# Patient Record
Sex: Female | Born: 1965 | Race: White | Hispanic: No | Marital: Married | Smoking: Never smoker
Health system: Southern US, Community
[De-identification: ages and names within clinical notes are randomized; demographics above are authoritative.]

## PROBLEM LIST (undated history)

## (undated) DIAGNOSIS — G43909 Migraine, unspecified, not intractable, without status migrainosus: Secondary | ICD-10-CM

## (undated) DIAGNOSIS — Z9889 Other specified postprocedural states: Secondary | ICD-10-CM

## (undated) DIAGNOSIS — G5603 Carpal tunnel syndrome, bilateral upper limbs: Secondary | ICD-10-CM

## (undated) DIAGNOSIS — T7840XA Allergy, unspecified, initial encounter: Secondary | ICD-10-CM

## (undated) DIAGNOSIS — R112 Nausea with vomiting, unspecified: Secondary | ICD-10-CM

## (undated) DIAGNOSIS — E7800 Pure hypercholesterolemia, unspecified: Secondary | ICD-10-CM

## (undated) DIAGNOSIS — I10 Essential (primary) hypertension: Secondary | ICD-10-CM

## (undated) HISTORY — DX: Carpal tunnel syndrome, bilateral upper limbs: G56.03

## (undated) HISTORY — PX: APPENDECTOMY: SHX54

## (undated) HISTORY — DX: Migraine, unspecified, not intractable, without status migrainosus: G43.909

---

## 2003-02-15 HISTORY — PX: OTHER SURGICAL HISTORY: SHX169

## 2007-04-20 ENCOUNTER — Ambulatory Visit: Payer: Self-pay | Admitting: Internal Medicine

## 2008-10-02 ENCOUNTER — Ambulatory Visit: Payer: Self-pay | Admitting: Internal Medicine

## 2009-02-14 HISTORY — PX: TOTAL ABDOMINAL HYSTERECTOMY: SHX209

## 2009-10-23 ENCOUNTER — Ambulatory Visit: Payer: Self-pay | Admitting: Internal Medicine

## 2010-11-11 ENCOUNTER — Ambulatory Visit: Payer: Self-pay | Admitting: Family Medicine

## 2011-04-29 ENCOUNTER — Ambulatory Visit: Payer: Self-pay | Admitting: Unknown Physician Specialty

## 2012-04-06 ENCOUNTER — Ambulatory Visit: Payer: Self-pay | Admitting: Family Medicine

## 2013-05-03 ENCOUNTER — Ambulatory Visit: Payer: Self-pay | Admitting: Family Medicine

## 2013-05-09 ENCOUNTER — Ambulatory Visit: Payer: Self-pay | Admitting: Family Medicine

## 2014-05-09 ENCOUNTER — Ambulatory Visit: Payer: Self-pay | Admitting: Family Medicine

## 2015-12-07 ENCOUNTER — Other Ambulatory Visit: Payer: Self-pay | Admitting: Internal Medicine

## 2015-12-07 DIAGNOSIS — Z0000 Encounter for general adult medical examination without abnormal findings: Secondary | ICD-10-CM

## 2016-01-22 ENCOUNTER — Ambulatory Visit: Payer: Self-pay

## 2016-01-25 ENCOUNTER — Ambulatory Visit
Admission: RE | Admit: 2016-01-25 | Discharge: 2016-01-25 | Disposition: A | Discharge status: Home / Self Care | Payer: 59 | Source: Ambulatory Visit | Attending: Internal Medicine | Admitting: Internal Medicine

## 2016-01-25 DIAGNOSIS — Z1231 Encounter for screening mammogram for malignant neoplasm of breast: Secondary | ICD-10-CM | POA: Diagnosis present

## 2016-01-25 DIAGNOSIS — Z0000 Encounter for general adult medical examination without abnormal findings: Secondary | ICD-10-CM

## 2016-09-09 ENCOUNTER — Other Ambulatory Visit: Payer: Self-pay | Admitting: Internal Medicine

## 2016-09-09 DIAGNOSIS — R7401 Elevation of levels of liver transaminase levels: Secondary | ICD-10-CM

## 2016-09-09 DIAGNOSIS — R740 Nonspecific elevation of levels of transaminase and lactic acid dehydrogenase [LDH]: Principal | ICD-10-CM

## 2016-09-12 ENCOUNTER — Ambulatory Visit
Admission: RE | Admit: 2016-09-12 | Discharge: 2016-09-12 | Disposition: A | Discharge status: Home / Self Care | Payer: 59 | Source: Ambulatory Visit | Attending: Internal Medicine | Admitting: Internal Medicine

## 2016-09-12 DIAGNOSIS — R7401 Elevation of levels of liver transaminase levels: Secondary | ICD-10-CM

## 2016-09-12 DIAGNOSIS — R740 Nonspecific elevation of levels of transaminase and lactic acid dehydrogenase [LDH]: Principal | ICD-10-CM

## 2016-11-01 ENCOUNTER — Encounter: Payer: Self-pay | Admitting: Neurology

## 2016-11-02 ENCOUNTER — Encounter: Payer: Self-pay | Admitting: Neurology

## 2016-11-02 ENCOUNTER — Ambulatory Visit (INDEPENDENT_AMBULATORY_CARE_PROVIDER_SITE_OTHER): Payer: 59 | Admitting: Neurology

## 2016-11-02 VITALS — BP 121/88 | HR 82 | Ht 62.0 in | Wt 180.0 lb

## 2016-11-02 DIAGNOSIS — N951 Menopausal and female climacteric states: Secondary | ICD-10-CM

## 2016-11-02 DIAGNOSIS — R0683 Snoring: Secondary | ICD-10-CM | POA: Diagnosis not present

## 2016-11-02 NOTE — Progress Notes (Signed)
SLEEP MEDICINE CLINIC   Provider:  Larey Seat, M D  Primary Care Physician:  Leanna Battles, MD   Referring Provider: Leanna Battles, MD    Chief Complaint  Patient presents with  . New Patient (Initial Visit)    pt here with husband. rm 10. pt states she snores. pt feels tired thru out the day.    HPI:  Daisy Robertson is a 51 y.o. female , seen here as in a referral  from Dr. Philip Aspen for Evaluation of possible sleep apnea.  Daisy Robertson is a Caucasian, right-handed married female patient of Dr. Shon Baton who presents with a chief complaint of nonrestorative sleep. She also experiences hot flashes at night, wakes up not feeling as refreshed as she used to. Her husband has witnessed her to snore. The hot flashes also has been present for at least 2-3 years. The patient is status post partial hysterectomy and has a Raquel Sarna history of breast cancer in one sister, her mother had coronary artery disease, and she had a vague recollection of a paternal uncle died of cancer, her own father died of cancer when she was age 57.   Sleep habits are as follows: The patient aims for a bedtime of 11 PM-. usually spends the last hour before she goes to bed watching TV but the couple also has an active social life. She describes her bedroom is cool and quiet but not dark.  The couple has a cat in bed with them. Daisy Robertson has no problems to go to sleep, her husband usually falls asleep later than her but also can sleep longer. He uses a CPAP machine and carries a diagnosis of obstructive sleep apnea. Daisy Robertson sleeps on her back and sides, not prone. Has no nocturia, and usually does not wake up other than from feeling hot or cold. She rises in the morning at 4:30 and is usually awake spontaneously at that time. She usually hits the snooze button on her alarm one time before she goes.   Sleep medical history and family sleep history: breast cancer in one of 2 sisters, father died of a cancer ,  unknown type. Paternal uncle,too. Mother died at 87 COPD, CAD, CVA. Maternal aunt had leukemia and COPD, CAD. maternal grandmother died of MI, as did one brother.   Social history: married to " weatherman " Chrissie Noa, childless, both are remarried. Caffeine one cup each morning, seldom iced tea, not soda.  Alcohol none,  No tobacco [place].Works at [employer].   Review of Systems: Out of a complete 14 system review, the patient complains of only the following symptoms, and all other reviewed systems are negative.    Epworth score  12 , Fatigue severity score 30  , depression score n/a    Social History   Social History  . Marital status: Married    Spouse name: N/A  . Number of children: N/A  . Years of education: N/A   Occupational History  . Not on file.   Social History Main Topics  . Smoking status: Never Smoker  . Smokeless tobacco: Never Used  . Alcohol use No  . Drug use: No  . Sexual activity: Not on file   Other Topics Concern  . Not on file   Social History Narrative  . No narrative on file    Family History  Problem Relation Age of Onset  . Breast cancer Sister   . Heart failure Mother   . CAD Mother   .  Heart attack Brother   . Hyperlipidemia Brother     Past Medical History:  Diagnosis Date  . Bilateral carpal tunnel syndrome   . Migraines     Past Surgical History:  Procedure Laterality Date  . APPENDECTOMY     1981  . bunionectomy Left 2005  . TOTAL ABDOMINAL HYSTERECTOMY  2011    Current Outpatient Prescriptions  Medication Sig Dispense Refill  . aspirin EC 81 MG tablet Take by mouth.    Marland Kitchen atorvastatin (LIPITOR) 10 MG tablet TAKE 1 TABLET BY MOUTH ONCE DAILY.    Marland Kitchen loratadine-pseudoephedrine (CLARITIN-D 12-HOUR) 5-120 MG tablet Take 1 tablet by mouth 2 (two) times daily.    . Multiple Vitamin (MULTI-VITAMINS) TABS Take by mouth.     No current facility-administered medications for this visit.     Allergies as of 11/02/2016 - never reviewed    Allergen Reaction Noted  . Codeine Diarrhea, Nausea Only, and Nausea And Vomiting 08/26/2013    Vitals: BP 121/88   Pulse 82   Ht 5\' 2"  (1.575 m)   Wt 180 lb (81.6 kg)   BMI 32.92 kg/m  Last Weight:  Wt Readings from Last 1 Encounters:  11/02/16 180 lb (81.6 kg)   KGY:JEHU mass index is 32.92 kg/m.     Last Height:   Ht Readings from Last 1 Encounters:  11/02/16 5\' 2"  (1.575 m)    Physical exam:  General: The patient is awake, alert and appears not in acute distress. The patient is well groomed. Head: Normocephalic, atraumatic. Neck is supple. Mallampati 4 - tip of uvula not seen.   neck circumference: 16. Nasal airflow patent- some rhinitis, TMJ is notevident . Retrognathia is seen. Wore never any braces or retainers Cardiovascular:  Regular rate and rhythm, without  murmurs or carotid bruit, and without distended neck veins. Respiratory: Lungs are clear to auscultation.Skin:  Without evidence of edema, or rash.Trunk: BMI is 33. The patient's posture is erect.  Neurologic exam : The patient is awake and alert, oriented to place and time.   Attention span & concentration ability appears normal.  Speech is fluent,  without dysarthria, dysphonia or aphasia.  Mood and affect are appropriate.  Cranial nerves: Pupils are equal and briskly reactive to light. Funduscopic exam without evidence of pallor or edema.  Extraocular movements  in vertical and horizontal planes intact and without nystagmus. Visual fields by finger perimetry are intact. Hearing- loss- has hearing aids. Left ear most affected.  Facial sensation intact to fine touch. Facial motor strength is symmetric and tongue and uvula move midline. Shoulder shrug was symmetrical.  Motor exam:   Normal tone, muscle bulk and symmetric strength in all extremities. Sensory:  Fine touch, pinprick and vibration were tested in all extremities. Proprioception tested in the upper extremities was normal. Coordination:   Finger-to-nose maneuver  normal without evidence of ataxia, dysmetria or tremor. Gait and station: Patient walks without assistive device and is able unassisted to climb up to the exam table. Strength within normal limits. Stance is stable and normal. Turns with 3 Steps.  Deep tendon reflexes: in the upper and lower extremities are symmetric and intact. Babinski maneuver response is downgoing.   Assessment:  After physical and neurologic examination, review of laboratory studies,  Personal review of imaging studies, reports of other /same  Imaging studies, results of polysomnography and / or neurophysiology testing and pre-existing records as far as provided in visit., my assessment is   1)  except for a Mrs.  Englander reported snoring I have not had a report of her actually having apnea. She does have risk factors including an elevated BMI at 33, and elevated neck circumference, and a high-grade Mallampati. Her sleep quality may have actually decreased with the described menopausal symptoms. Since her sister was diagnosed with breast cancer she may be not considered a hormone replacement candidate, but I would like her to discuss this with her primary doctor and gynecologist. The hot flashes at night seem to play a major role in her sleep fragmentation and she may respond to bioidentical hormones such as soy products or just to a low-dose progesterone at night only.  The patient was advised of the nature of the diagnosed disorder , the treatment options and the  risks for general health and wellness arising from not treating the condition.   I spent more than  45 minutes of face to face time with the patient.  Greater than 50% of time was spent in counseling and coordination of care. We have discussed the diagnosis and differential and I answered the patient's questions.    Plan:  Treatment plan and additional workup :   HST for apnea screening.   Rv after wards.   Larey Seat, MD 02/15/7508,  2:58 AM  Certified in Neurology by ABPN Certified in [place] by [facility] [address], [place], [place] [address]

## 2016-11-02 NOTE — Patient Instructions (Signed)

## 2016-11-30 ENCOUNTER — Ambulatory Visit (INDEPENDENT_AMBULATORY_CARE_PROVIDER_SITE_OTHER): Payer: 59 | Admitting: Neurology

## 2016-11-30 DIAGNOSIS — G4733 Obstructive sleep apnea (adult) (pediatric): Secondary | ICD-10-CM | POA: Diagnosis not present

## 2016-11-30 DIAGNOSIS — N951 Menopausal and female climacteric states: Secondary | ICD-10-CM

## 2016-11-30 DIAGNOSIS — R0683 Snoring: Secondary | ICD-10-CM

## 2016-12-05 NOTE — Procedures (Signed)
[place], [place] [address] NAME:    Daisy Robertson                                                                           DOB: Nov 12, 1965 MEDICAL RECORD NUMBER   [id number]                                                             DOS: 12/01/2016 REFERRING PHYSICIAN: Leanna Battles, M.D. STUDY PERFORMED: Home Sleep Study HISTORY: Study ordered to rule out Apnea, based on witnessed snoring, apnea and excessive daytime sleepiness.       Daisy Robertson is a Caucasian, right-handed married female patient of Dr. Shon Baton who presents with a chief complaint of nonrestorative sleep. She also experiences hot flashes at night, wakes up not feeling as refreshed as she used to. Her husband has witnessed her to snore, she reports hot flashes present for at least 2-3 years.  Epworth Sleepiness score endorsed at 12 points, Fatigue severity score 30, BMI 33.   STUDY RESULTS: Total Recording: 7 hours 12 minutes Total Apnea/Hypopnea Index (AHI):  9.2 /hour and RDI 12.5/hr. Average Oxygen Saturation (SpO2) 94%, Lowest Oxygen Saturation: 82 %  Time Oxygen Saturation Below 89%: 5 min. = 1 % Average Heart Rate:  77 bpm (60-184)  IMPRESSION:  1) Mild sleep apnea, obstructive, with moderate snoring  2) Clinically significant hypoxemia  3) Tachycardia without bradycardia RECOMMENDATION: This degree of sleep apnea can be treated with CPAP, but may respond to a dental device or simply weight loss.  I certify that I have reviewed the raw data recording prior to the issuance of this report in accordance with the standards of the American Academy of Sleep Medicine (AASM). Larey Seat, MD   12-05-2016 Medical Director of [facility], and Diplomat of the ABPN and ABSM Accredited by [employer]

## 2016-12-06 ENCOUNTER — Telehealth: Payer: Self-pay | Admitting: Neurology

## 2016-12-06 NOTE — Telephone Encounter (Signed)
Called pt to discuss sleep study results. No answer from the patient. Pt does not have voicemail set up so was unable to LVM. Will try again.

## 2016-12-06 NOTE — Telephone Encounter (Signed)
Called the patient at the work number provided by the patient. No answer. LVM for the patient to call back.

## 2016-12-06 NOTE — Telephone Encounter (Signed)
Patient is returning a call. She can be reached at [phone] which is her work number until 4:30pm. Please leave a message if you get VM. After that time she can be reached on her cell phone at [phone].

## 2016-12-06 NOTE — Telephone Encounter (Signed)
-----   Message from Larey Seat, MD sent at 12/05/2016  8:28 AM EDT ----- Mild sleep apnea, moderate snoring. The treatment options are CPAP, dental device or weight loss. BMI should be 26. We can meet and discuss options. Larey Seat, MD

## 2016-12-06 NOTE — Telephone Encounter (Signed)
Pt returned call and I went over the sleep study results. Pt verbalized understanding and states that she would call us with what she decides to do rather it is the dental device or cpap or weight loss.

## 2017-01-09 ENCOUNTER — Other Ambulatory Visit: Payer: Self-pay | Admitting: Internal Medicine

## 2017-01-09 DIAGNOSIS — Z0000 Encounter for general adult medical examination without abnormal findings: Secondary | ICD-10-CM

## 2017-04-04 ENCOUNTER — Ambulatory Visit
Admission: RE | Admit: 2017-04-04 | Discharge: 2017-04-04 | Disposition: A | Discharge status: Home / Self Care | Payer: Managed Care, Other (non HMO) | Source: Ambulatory Visit | Attending: Internal Medicine | Admitting: Internal Medicine

## 2017-04-04 DIAGNOSIS — Z1231 Encounter for screening mammogram for malignant neoplasm of breast: Secondary | ICD-10-CM | POA: Insufficient documentation

## 2017-04-04 DIAGNOSIS — Z0000 Encounter for general adult medical examination without abnormal findings: Secondary | ICD-10-CM

## 2017-11-27 ENCOUNTER — Ambulatory Visit (INDEPENDENT_AMBULATORY_CARE_PROVIDER_SITE_OTHER): Payer: Self-pay

## 2017-11-27 ENCOUNTER — Ambulatory Visit: Payer: Managed Care, Other (non HMO)

## 2017-11-27 DIAGNOSIS — Z1211 Encounter for screening for malignant neoplasm of colon: Secondary | ICD-10-CM

## 2017-11-27 MED ORDER — NA SULFATE-K SULFATE-MG SULF 17.5-3.13-1.6 GM/177ML PO SOLN: 1.0000 | ORAL | 0 refills | Status: DC

## 2017-11-27 NOTE — Patient Instructions (Signed)
Daisy Robertson  Feb 19, 1965 MRN: [id number]     Procedure Date: 02/05/18 Time to register: 7:30am Place to register: [facility] Procedure Time: 8:30am Scheduled provider: Barney Drain, MD    PREPARATION FOR COLONOSCOPY WITH SUPREP BOWEL PREP KIT  Note: Suprep Bowel Prep Kit is a split-dose (2day) regimen. Consumption of BOTH 6-ounce bottles is required for a complete prep.  Please notify us immediately if you are diabetic, take iron supplements, or if you are on Coumadin or any other blood thinners.                                                                                                                                                   1 DAY BEFORE PROCEDURE:  DATE: 02/04/18   DAY: Sunday   clear liquids the entire day - NO SOLID FOOD.   At 6:00pm: Complete steps 1 through 4 below, using ONE (1) 6-ounce bottle, before going to bed. Step 1:  Pour ONE (1) 6-ounce bottle of SUPREP liquid into the mixing container.  Step 2:  Add cool drinking water to the 16 ounce line on the container and mix.  Note: Dilute the solution concentrate as directed prior to use. Step 3:  DRINK ALL the liquid in the container. Step 4:  You MUST drink an additional two (2) or more 16 ounce containers of water over the next one (1) hour.   Continue clear liquids.  DAY OF PROCEDURE:   DATE: 02/05/18   DAY: Monday If you take medications for your heart, blood pressure, or breathing, you may take these medications.   5 hours before your procedure at :3:30am Step 1:  Pour ONE (1) 6-ounce bottle of SUPREP liquid into the mixing container.  Step 2:  Add cool drinking water to the 16 ounce line on the container and mix.  Note: Dilute the solution concentrate as directed prior to use. Step 3:  DRINK ALL the liquid in the container. Step 4:  You MUST drink an additional two (2) or more 16 ounce containers of water over the next one (1) hour. You MUST complete the final glass of water at least 3  hours before your colonoscopy.   Nothing by mouth past 5:30am  You may take your morning medications with sip of water unless we have instructed otherwise.    Please see below for Dietary Information.  CLEAR LIQUIDS INCLUDE:  Water Jello (NOT red in color)   Ice Popsicles (NOT red in color)   Tea (sugar ok, no milk/cream) Powdered fruit flavored drinks  Coffee (sugar ok, no milk/cream) Gatorade/ Lemonade/ Kool-Aid  (NOT red in color)   Juice: apple, white grape, white cranberry Soft drinks  Clear bullion, consomme, broth (fat free beef/chicken/vegetable)  Carbonated beverages (any kind)  Strained chicken noodle soup Hard Candy   Remember: Clear liquids are  liquids that will allow you to see your fingers on the other side of a clear glass. Be sure liquids are NOT red in color, and not cloudy, but CLEAR.  DO NOT EAT OR DRINK ANY OF THE FOLLOWING:  Dairy products of any kind   Cranberry juice Tomato juice / V8 juice   Grapefruit juice Orange juice     Red grape juice  Do not eat any solid foods, including such foods as: cereal, oatmeal, yogurt, fruits, vegetables, creamed soups, eggs, bread, crackers, pureed foods in a blender, etc.   HELPFUL HINTS FOR DRINKING PREP SOLUTION:   Make sure prep is extremely cold. Mix and refrigerate the the morning of the prep. You may also put in the freezer.   You may try mixing some Crystal Light or Country Time Lemonade if you prefer. Mix in small amounts; add more if necessary.  Try drinking through a straw  Rinse mouth with water or a mouthwash between glasses, to remove after-taste.  Try sipping on a cold beverage /ice/ popsicles between glasses of prep.  Place a piece of sugar-free hard candy in mouth between glasses.  If you become nauseated, try consuming smaller amounts, or stretch out the time between glasses. Stop for 30-60 minutes, then slowly start back drinking.     OTHER INSTRUCTIONS  You will need a responsible adult at  least 50 years of age to accompany you and drive you home. This person must remain in the waiting room during your procedure. The hospital will cancel your procedure if you do not have a responsible adult with you.   1. Wear loose fitting clothing that is easily removed. 2. Leave jewelry and other valuables at home.  3. Remove all body piercing jewelry and leave at home. 4. Total time from sign-in until discharge is approximately 2-3 hours. 5. You should go home directly after your procedure and rest. You can resume normal activities the day after your procedure. 6. The day of your procedure you should not:  Drive  Make legal decisions  Operate machinery  Drink alcohol  Return to work   You may call the office (Dept: [phone]) before 5:00pm, or page the doctor on call [phone]) after 5:00pm, for further instructions, if necessary.   Insurance Information YOU WILL NEED TO CHECK WITH YOUR INSURANCE COMPANY FOR THE BENEFITS OF COVERAGE YOU HAVE FOR THIS PROCEDURE.  UNFORTUNATELY, NOT ALL INSURANCE COMPANIES HAVE BENEFITS TO COVER ALL OR PART OF THESE TYPES OF PROCEDURES.  IT IS YOUR RESPONSIBILITY TO CHECK YOUR BENEFITS, HOWEVER, WE WILL BE GLAD TO ASSIST YOU WITH ANY CODES YOUR INSURANCE COMPANY MAY NEED.    PLEASE NOTE THAT MOST INSURANCE COMPANIES WILL NOT COVER A SCREENING COLONOSCOPY FOR PEOPLE UNDER THE AGE OF 50  IF YOU HAVE BCBS INSURANCE, YOU MAY HAVE BENEFITS FOR A SCREENING COLONOSCOPY BUT IF POLYPS ARE FOUND THE DIAGNOSIS WILL CHANGE AND THEN YOU MAY HAVE A DEDUCTIBLE THAT WILL NEED TO BE MET. SO PLEASE MAKE SURE YOU CHECK YOUR BENEFITS FOR A SCREENING COLONOSCOPY AS WELL AS A DIAGNOSTIC COLONOSCOPY.

## 2017-11-27 NOTE — Progress Notes (Signed)
Gastroenterology Pre-Procedure Review  Request Date:11/27/17 Requesting Physician: [place]- no previous tcs.   PATIENT REVIEW QUESTIONS: The patient responded to the following health history questions as indicated:    1. Diabetes Melitis: no 2. Joint replacements in the past 12 months: no 3. Major health problems in the past 3 months: no 4. Has an artificial valve or MVP: no 5. Has a defibrillator: no 6. Has been advised in past to take antibiotics in advance of a procedure like teeth cleaning: no 7. Family history of colon cancer: no  8. Alcohol Use: no 9. History of sleep apnea: no  10. History of coronary artery or other vascular stents placed within the last 12 months: no 11. History of any prior anesthesia complications: no    MEDICATIONS & ALLERGIES:    Patient reports the following regarding taking any blood thinners:   Plavix? no Aspirin? yes (81mg ) Coumadin? no Brilinta? no Xarelto? no Eliquis? no Pradaxa? no Savaysa? no Effient? no  Patient confirms/reports the following medications:  Current Outpatient Medications  Medication Sig Dispense Refill  . aspirin EC 81 MG tablet Take by mouth.    . folic acid (FOLVITE) 287 MCG tablet Take 400 mcg by mouth daily.    Marland Kitchen gabapentin (NEURONTIN) 400 MG capsule Take 400 mg by mouth 3 (three) times daily.    Marland Kitchen losartan-hydrochlorothiazide (HYZAAR) 100-12.5 MG tablet Take 1 tablet by mouth daily.    . Multiple Vitamin (MULTI-VITAMINS) TABS Take by mouth.    . Omega-3 Fatty Acids (SALMON OIL-1000 PO) Take by mouth daily.    . pravastatin (PRAVACHOL) 20 MG tablet 20 mg daily.  1  . vitamin B-12 (CYANOCOBALAMIN) 1000 MCG tablet Take 1,000 mcg by mouth daily.    . vitamin B-6 (PYRIDOXINE) 25 MG tablet Take 25 mg by mouth daily.     No current facility-administered medications for this visit.     Patient confirms/reports the following allergies:  Allergies  Allergen Reactions  . Codeine  Diarrhea, Nausea Only and Nausea And Vomiting    No orders of the defined types were placed in this encounter.   AUTHORIZATION INFORMATION Primary Insurance: Unknown Jim #: [id number] Pre-Cert / Josem Kaufmann required: no  SCHEDULE INFORMATION: Procedure has been scheduled as follows:  Date: 02/05/18, Time: 8:30 Location: [facility]  This Gastroenterology Pre-Precedure Review Form is being routed to the following provider(s): Neil Crouch, PA

## 2017-12-07 NOTE — Progress Notes (Signed)
Ok to schedule.

## 2018-02-05 ENCOUNTER — Ambulatory Visit (HOSPITAL_COMMUNITY)
Admission: RE | Admit: 2018-02-05 | Discharge: 2018-02-05 | Disposition: A | Discharge status: Home / Self Care | Payer: Managed Care, Other (non HMO) | Attending: Gastroenterology | Admitting: Gastroenterology

## 2018-02-05 ENCOUNTER — Encounter (HOSPITAL_COMMUNITY): Payer: Self-pay | Admitting: *Deleted

## 2018-02-05 ENCOUNTER — Other Ambulatory Visit: Payer: Self-pay

## 2018-02-05 ENCOUNTER — Encounter (HOSPITAL_COMMUNITY)
Admission: RE | Disposition: A | Discharge status: Home / Self Care | Payer: Self-pay | Source: Home / Self Care | Attending: Gastroenterology

## 2018-02-05 DIAGNOSIS — K621 Rectal polyp: Secondary | ICD-10-CM

## 2018-02-05 DIAGNOSIS — Z7982 Long term (current) use of aspirin: Secondary | ICD-10-CM | POA: Insufficient documentation

## 2018-02-05 DIAGNOSIS — D120 Benign neoplasm of cecum: Secondary | ICD-10-CM | POA: Diagnosis not present

## 2018-02-05 DIAGNOSIS — K648 Other hemorrhoids: Secondary | ICD-10-CM | POA: Diagnosis not present

## 2018-02-05 DIAGNOSIS — Q438 Other specified congenital malformations of intestine: Secondary | ICD-10-CM | POA: Diagnosis not present

## 2018-02-05 DIAGNOSIS — D123 Benign neoplasm of transverse colon: Secondary | ICD-10-CM | POA: Diagnosis not present

## 2018-02-05 DIAGNOSIS — K5730 Diverticulosis of large intestine without perforation or abscess without bleeding: Secondary | ICD-10-CM | POA: Insufficient documentation

## 2018-02-05 DIAGNOSIS — Z1211 Encounter for screening for malignant neoplasm of colon: Secondary | ICD-10-CM | POA: Diagnosis present

## 2018-02-05 DIAGNOSIS — I10 Essential (primary) hypertension: Secondary | ICD-10-CM | POA: Insufficient documentation

## 2018-02-05 DIAGNOSIS — Z79899 Other long term (current) drug therapy: Secondary | ICD-10-CM | POA: Insufficient documentation

## 2018-02-05 DIAGNOSIS — D122 Benign neoplasm of ascending colon: Secondary | ICD-10-CM

## 2018-02-05 DIAGNOSIS — E7800 Pure hypercholesterolemia, unspecified: Secondary | ICD-10-CM | POA: Diagnosis not present

## 2018-02-05 HISTORY — DX: Essential (primary) hypertension: I10

## 2018-02-05 HISTORY — PX: COLONOSCOPY: SHX5424

## 2018-02-05 HISTORY — PX: POLYPECTOMY: SHX5525

## 2018-02-05 HISTORY — DX: Nausea with vomiting, unspecified: R11.2

## 2018-02-05 HISTORY — DX: Other specified postprocedural states: Z98.890

## 2018-02-05 HISTORY — DX: Pure hypercholesterolemia, unspecified: E78.00

## 2018-02-05 HISTORY — DX: Allergy, unspecified, initial encounter: T78.40XA

## 2018-02-05 SURGERY — COLONOSCOPY
Anesthesia: Moderate Sedation

## 2018-02-05 MED ORDER — ONDANSETRON HCL 4 MG/2ML IJ SOLN
INTRAMUSCULAR | Status: DC | PRN
  Administered 2018-02-05: 4 mg via INTRAVENOUS

## 2018-02-05 MED ORDER — ONDANSETRON HCL 4 MG/2ML IJ SOLN
INTRAMUSCULAR | Status: AC
  Filled 2018-02-05: qty 2

## 2018-02-05 MED ORDER — MEPERIDINE HCL 100 MG/ML IJ SOLN
INTRAMUSCULAR | Status: AC
  Filled 2018-02-05: qty 2

## 2018-02-05 MED ORDER — MIDAZOLAM HCL 5 MG/5ML IJ SOLN
INTRAMUSCULAR | Status: AC
  Filled 2018-02-05: qty 10

## 2018-02-05 MED ORDER — STERILE WATER FOR IRRIGATION IR SOLN
Status: DC | PRN
  Administered 2018-02-05: 1.5 mL

## 2018-02-05 MED ORDER — MIDAZOLAM HCL 5 MG/5ML IJ SOLN
INTRAMUSCULAR | Status: DC | PRN
  Administered 2018-02-05 (×2): 2 mg via INTRAVENOUS
  Administered 2018-02-05: 1 mg via INTRAVENOUS

## 2018-02-05 MED ORDER — SODIUM CHLORIDE 0.9 % IV SOLN
INTRAVENOUS | Status: DC
  Administered 2018-02-05: 08:00:00 via INTRAVENOUS

## 2018-02-05 MED ORDER — MEPERIDINE HCL 100 MG/ML IJ SOLN
INTRAMUSCULAR | Status: DC | PRN
  Administered 2018-02-05 (×2): 25 mg

## 2018-02-05 NOTE — Op Note (Addendum)
[facility] Patient Name: Daisy Robertson Procedure Date: 02/05/2018 8:29 AM MRN: [id number] Date of Birth: March 02, 1965 Attending MD: Barney Drain MD, MD CSN: [id number] Age: 52 Admit Type: Outpatient Procedure:                Colonoscopy WITH COLD SNARE POLYPECTOMY Indications:              Screening for colorectal malignant neoplasm Providers:                Barney Drain MD, MD, Gerome Sam, RN, Tammy                            Vaught, RN, Nelma Rothman, Technician Referring MD:             Ermalene Searing. Philip Aspen MD, MD Medicines:                Ondansetron 4 mg IV, Meperidine 50 mg IV, Midazolam                            5 mg IV Complications:            No immediate complications. Estimated Blood Loss:     Estimated blood loss was minimal. Procedure:                Pre-Anesthesia Assessment:                           - Prior to the procedure, a History and Physical                            was performed, and patient medications and                            allergies were reviewed. The patient's tolerance of                            previous anesthesia was also reviewed. The risks                            and benefits of the procedure and the sedation                            options and risks were discussed with the patient.                            All questions were answered, and informed consent                            was obtained. Prior Anticoagulants: The patient                            last took aspirin 2 days prior to the procedure.                            ASA Grade Assessment: II - A patient with mild  systemic disease. After reviewing the risks and                            benefits, the patient was deemed in satisfactory                            condition to undergo the procedure. After obtaining                            informed consent, the colonoscope was passed under                            direct vision.  Throughout the procedure, the                            patient's blood pressure, pulse, and oxygen                            saturations were monitored continuously. The                            [id number]-[id number] ([id number]) scope was introduced through                            the anus and advanced to the the cecum, identified                            by appendiceal orifice and ileocecal valve. The                            colonoscopy was somewhat difficult due to a                            tortuous colon. Successful completion of the                            procedure was aided by straightening and shortening                            the scope to obtain bowel loop reduction and                            COLOWRAP. The patient tolerated the procedure well.                            The quality of the bowel preparation was good. The                            ileocecal valve, appendiceal orifice, and rectum                            were photographed. Scope In: 8:49:03 AM Scope Out: 9:13:52 AM Scope Withdrawal Time: 0 hours 23 minutes 10 seconds  Total Procedure Duration:  0 hours 24 minutes 49 seconds  Findings:      Four sessile polyps were found in the transverse colon(2), hepatic       flexure and cecum. The polyps were 3 to 5 mm in size. These polyps were       removed with a cold snare. Resection and retrieval were complete.      A 6 mm polyp was found in the splenic flexure. The polyp was sessile.       The polyp was removed with a cold snare. Resection and retrieval were       complete. Coagulation for hemostasis of bleeding caused by the procedure       using snare was successful.      Internal hemorrhoids were found. The hemorrhoids were small.      The recto-sigmoid colon and sigmoid colon were mildly redundant. Impression:               - Four 3 to 5 mm polyps in the transverse colon(2),                            at the hepatic flexure and in the cecum, removed                             with a cold snare. Resected and retrieved.                           - One 6 mm polyp in the splenic flexure, removed                            with a hot snare. Resected and retrieved.                           - Diverticulosis in the sigmoid colon.                           - Internal hemorrhoids.                           - Redundant colon. Moderate Sedation:      Moderate (conscious) sedation was administered by the endoscopy nurse       and supervised by the endoscopist. The following parameters were       monitored: oxygen saturation, heart rate, blood pressure, and response       to care. Total physician intraservice time was 36 minutes. Recommendation:           - Patient has a contact number available for                            emergencies. The signs and symptoms of potential                            delayed complications were discussed with the                            patient. Return to normal activities tomorrow.  Written discharge instructions were provided to the                            patient.                           - High fiber diet.                           - Continue present medications.                           - Await pathology results.                           - Repeat colonoscopy in 3 years for surveillance. Procedure Code(s):        --- Professional ---                           [phone], Colonoscopy, flexible; with removal of                            tumor(s), polyp(s), or other lesion(s) by snare                            technique                           99153, Moderate sedation; each additional 15                            minutes intraservice time                           G0500, Moderate sedation services provided by the                            same physician or other qualified health care                            professional performing a gastrointestinal                             endoscopic service that sedation supports,                            requiring the presence of an independent trained                            observer to assist in the monitoring of the                            patient's level of consciousness and physiological                            status; initial 15 minutes of intra-service time;  patient age 46 years or older (additional time may                            be reported with [phone], as appropriate) Diagnosis Code(s):        --- Professional ---                           Z12.11, Encounter for screening for malignant                            neoplasm of colon                           D12.3, Benign neoplasm of transverse colon (hepatic                            flexure or splenic flexure)                           D12.2, Benign neoplasm of ascending colon                           K62.1, Rectal polyp                           K64.8, Other hemorrhoids                           K57.30, Diverticulosis of large intestine without                            perforation or abscess without bleeding                           Q43.8, Other specified congenital malformations of                            intestine CPT copyright 2018 American Medical Association. All rights reserved. The codes documented in this report are preliminary and upon coder review may  be revised to meet current compliance requirements. Barney Drain, MD Barney Drain MD, MD 02/05/2018 10:58:37 AM This report has been signed electronically. Number of Addenda: 0

## 2018-02-05 NOTE — H&P (Signed)
Primary Care Physician:  Leanna Battles, MD Primary Gastroenterologist:  Dr. Oneida Alar  Pre-Procedure History & Physical: HPI:  Daisy Robertson is a 52 y.o. female here for [place].  Past Medical History:  Diagnosis Date  . Allergies   . Bilateral carpal tunnel syndrome   . Hypercholesteremia   . Hypertension   . Migraines   . PONV (postoperative nausea and vomiting)     Past Surgical History:  Procedure Laterality Date  . APPENDECTOMY     1981  . bunionectomy Left 2005  . TOTAL ABDOMINAL HYSTERECTOMY  2011    Prior to Admission medications   Medication Sig Start Date End Date Taking? Authorizing Provider  aspirin EC 81 MG tablet Take 81 mg by mouth every evening.    Yes [provider]  FOLIC ACID-VIT G9-EEF E07 PO Take 1 tablet by mouth daily.   Yes [provider]  gabapentin (NEURONTIN) 400 MG capsule Take 400 mg by mouth 3 (three) times daily.   Yes [provider]  losartan-hydrochlorothiazide (HYZAAR) 100-12.5 MG tablet Take 1 tablet by mouth daily.   Yes [provider]  Multiple Vitamin (MULTI-VITAMINS) TABS Take 1 tablet by mouth every evening.    Yes [provider]  Na Sulfate-K Sulfate-Mg Sulf (SUPREP BOWEL PREP KIT) 17.5-3.13-1.6 GM/177ML SOLN Take 1 kit by mouth as directed. 11/27/17  Yes Mahala Menghini, PA-C  Omega-3 Fatty Acids (SALMON OIL-1000 PO) Take 1,000 mg by mouth 2 (two) times daily.    Yes [provider]  pravastatin (PRAVACHOL) 20 MG tablet Take 20 mg by mouth every evening.  10/03/17  Yes [provider]  cetirizine (ZYRTEC) 10 MG tablet Take 10 mg by mouth at bedtime as needed for allergies.    [provider]  ibuprofen (ADVIL,MOTRIN) 200 MG tablet Take 400 mg by mouth daily as needed for moderate pain.    [provider]  loratadine-pseudoephedrine (CLARITIN-D 12 HOUR) 5-120 MG tablet Take 1 tablet by mouth 2 (two) times daily as needed for allergies.     [provider]    Allergies as of 11/28/2017 - Review Complete 11/27/2017  Allergen Reaction Noted  . Codeine Diarrhea, Nausea Only, and Nausea And Vomiting 08/26/2013    Family History  Problem Relation Age of Onset  . Breast cancer Sister   . Heart failure Mother   . CAD Mother   . Heart attack Brother   . Hyperlipidemia Brother   . Colon cancer Neg Hx   . Colon polyps Neg Hx     Social History   Socioeconomic History  . Marital status: Married    Spouse name: Not on file  . Number of children: Not on file  . Years of education: Not on file  . Highest education level: Not on file  Occupational History  . Not on file  Social Needs  . Financial resource strain: Not on file  . Food insecurity:    Worry: Not on file    Inability: Not on file  . Transportation needs:    Medical: Not on file    Non-medical: Not on file  Tobacco Use  . Smoking status: Never Smoker  . Smokeless tobacco: Never Used  Substance and Sexual Activity  . Alcohol use: No  . Drug use: No  . Sexual activity: Not on file  Lifestyle  . Physical activity:    Days per week: Not on file    Minutes per session: Not on file  .  Stress: Not on file  Relationships  . Social connections:    Talks on phone: Not on file    Gets together: Not on file    Attends religious service: Not on file    Active member of club or organization: Not on file    Attends meetings of clubs or organizations: Not on file    Relationship status: Not on file  . Intimate partner violence:    Fear of current or ex partner: Not on file    Emotionally abused: Not on file    Physically abused: Not on file    Forced sexual activity: Not on file  Other Topics Concern  . Not on file  Social History Narrative  . Not on file    Review of Systems: See HPI, otherwise negative ROS   Physical Exam: BP 133/73   Pulse 84   Temp 98.4 F (36.9 C) (Oral)   Resp 18   Ht 5' 2" (1.575 m)   Wt 81.6 kg   SpO2 99%    BMI 32.92 kg/m  General:   Alert,  pleasant and cooperative in NAD Head:  Normocephalic and atraumatic. Neck:  Supple; Lungs:  Clear throughout to auscultation.    Heart:  Regular rate and rhythm. Abdomen:  Soft, nontender and nondistended. Normal bowel sounds, without guarding, and without rebound.   Neurologic:  Alert and  oriented x4;  grossly normal neurologically.  Impression/Plan:    SCREENING  Plan:  1. TCS TODAY DISCUSSED PROCEDURE, BENEFITS, & RISKS: < 1% chance of medication reaction, bleeding, perforation, or rupture of spleen/liver.

## 2018-02-05 NOTE — Discharge Instructions (Signed)
You had 5 polyps removed. You have SMALL internal hemorrhoids.   DRINK WATER TO KEEP YOUR URINE LIGHT YELLOW.  FOLLOW A HIGH FIBER DIET. AVOID ITEMS THAT CAUSE BLOATING & GAS. SEE INFO BELOW.  CONTINUE YOUR WEIGHT LOSS EFFORTS. YOUR BODY MASS INDEX IS OVER 30 WHICH MEANS YOU ARE OBESE. OBESITY IS ASSOCIATED WITH AN INCREASED FOR CIRRHOSIS AND ALL CANCERS, INCLUDING ESOPHAGEAL AND COLON CANCER. A WEIGHT OF 160 LBS OR LESS  WILL GET YOUR BODY MASS INDEX(BMI) UNDER 30.  YOUR BIOPSY RESULTS WILL BE BACK IN 5 BUSINESS DAYS.   Next colonoscopy in 3 years.    Colonoscopy Care After Read the instructions outlined below and refer to this sheet in the next week. These discharge instructions provide you with general information on caring for yourself after you leave the hospital. While your treatment has been planned according to the most current medical practices available, unavoidable complications occasionally occur. If you have any problems or questions after discharge, call DR. Marie Chow, [phone].  ACTIVITY  You may resume your regular activity, but move at a slower pace for the next 24 hours.   Take frequent rest periods for the next 24 hours.   Walking will help get rid of the air and reduce the bloated feeling in your belly (abdomen).   No driving for 24 hours (because of the medicine (anesthesia) used during the test).   You may shower.   Do not sign any important legal documents or operate any machinery for 24 hours (because of the anesthesia used during the test).    NUTRITION  Drink plenty of fluids.   You may resume your normal diet as instructed by your doctor.   Begin with a light meal and progress to your normal diet. Heavy or fried foods are harder to digest and may make you feel sick to your stomach (nauseated).   Avoid alcoholic beverages for 24 hours or as instructed.    MEDICATIONS  You may resume your normal medications.   WHAT YOU CAN EXPECT  TODAY  Some feelings of bloating in the abdomen.   Passage of more gas than usual.   Spotting of blood in your stool or on the toilet paper  .  IF YOU HAD POLYPS REMOVED DURING THE COLONOSCOPY:  Eat a soft diet IF YOU HAVE NAUSEA, BLOATING, ABDOMINAL PAIN, OR VOMITING.    FINDING OUT THE RESULTS OF YOUR TEST Not all test results are available during your visit. DR. Oneida Alar WILL CALL YOU WITHIN 14 DAYS OF YOUR PROCEDUE WITH YOUR RESULTS. Do not assume everything is normal if you have not heard from DR. Cletis Clack, CALL HER OFFICE AT [phone].  SEEK IMMEDIATE MEDICAL ATTENTION AND CALL THE OFFICE: [phone] IF:  You have more than a spotting of blood in your stool.   Your belly is swollen (abdominal distention).   You are nauseated or vomiting.   You have a temperature over 101F.   You have abdominal pain or discomfort that is severe or gets worse throughout the day.   High-Fiber Diet A high-fiber diet changes your normal diet to include more whole grains, legumes, fruits, and vegetables. Changes in the diet involve replacing refined carbohydrates with unrefined foods. The calorie level of the diet is essentially unchanged. The Dietary Reference Intake (recommended amount) for adult males is 38 grams per day. For adult females, it is 25 grams per day. Pregnant and lactating women should consume 28 grams of fiber per day. Fiber is the intact part of  a plant that is not broken down during digestion. Functional fiber is fiber that has been isolated from the plant to provide a beneficial effect in the body. PURPOSE  Increase stool bulk.   Ease and regulate bowel movements.   Lower cholesterol.   REDUCE RISK OF COLON CANCER  INDICATIONS THAT YOU NEED MORE FIBER  Constipation and hemorrhoids.   Uncomplicated diverticulosis (intestine condition) and irritable bowel syndrome.   Weight management.   As a protective measure against hardening of the arteries (atherosclerosis),  diabetes, and cancer.   GUIDELINES FOR INCREASING FIBER IN THE DIET  Start adding fiber to the diet slowly. A gradual increase of about 5 more grams (2 slices of whole-wheat bread, 2 servings of most fruits or vegetables, or 1 bowl of high-fiber cereal) per day is best. Too rapid an increase in fiber may result in constipation, flatulence, and bloating.   Drink enough water and fluids to keep your urine clear or pale yellow. Water, juice, or caffeine-free drinks are recommended. Not drinking enough fluid may cause constipation.   Eat a variety of high-fiber foods rather than one type of fiber.   Try to increase your intake of fiber through using high-fiber foods rather than fiber pills or supplements that contain small amounts of fiber.   The goal is to change the types of food eaten. Do not supplement your present diet with high-fiber foods, but replace foods in your present diet.   INCLUDE A VARIETY OF FIBER SOURCES  Replace refined and processed grains with whole grains, canned fruits with fresh fruits, and incorporate other fiber sources. White rice, white breads, and most bakery goods contain little or no fiber.   Brown whole-grain rice, buckwheat oats, and many fruits and vegetables are all good sources of fiber. These include: broccoli, Brussels sprouts, cabbage, cauliflower, beets, sweet potatoes, white potatoes (skin on), carrots, tomatoes, eggplant, squash, berries, fresh fruits, and dried fruits.   Cereals appear to be the richest source of fiber. Cereal fiber is found in whole grains and bran. Bran is the fiber-rich outer coat of cereal grain, which is largely removed in refining. In whole-grain cereals, the bran remains. In breakfast cereals, the largest amount of fiber is found in those with "bran" in their names. The fiber content is sometimes indicated on the label.   You may need to include additional fruits and vegetables each day.   In baking, for 1 cup white flour, you may  use the following substitutions:   1 cup whole-wheat flour minus 2 tablespoons.   1/2 cup white flour plus 1/2 cup whole-wheat flour.   Polyps, Colon  A polyp is extra tissue that grows inside your body. Colon polyps grow in the large intestine. The large intestine, also called the colon, is part of your digestive system. It is a long, hollow tube at the end of your digestive tract where your body makes and stores stool. Most polyps are not dangerous. They are benign. This means they are not cancerous. But over time, some types of polyps can turn into cancer. Polyps that are smaller than a pea are usually not harmful. But larger polyps could someday become or may already be cancerous. To be safe, doctors remove all polyps and test them.   WHO GETS POLYPS? Anyone can get polyps, but certain people are more likely than others. You may have a greater chance of getting polyps if:  You are over 50.   You have had polyps before.   Someone  in your family has had polyps.   Someone in your family has had cancer of the large intestine.   Find out if someone in your family has had polyps. You may also be more likely to get polyps if you:   Eat a lot of fatty foods   Smoke   Drink alcohol   Do not exercise  Eat too much   PREVENTION There is not one sure way to prevent polyps. You might be able to lower your risk of getting them if you:  Eat more fruits and vegetables and less fatty food.   Do not smoke.   Avoid alcohol.   Exercise every day.   Lose weight if you are overweight.   Eating more calcium and folate can also lower your risk of getting polyps. Some foods that are rich in calcium are milk, cheese, and broccoli. Some foods that are rich in folate are chickpeas, kidney beans, and spinach.   Hemorrhoids Hemorrhoids are dilated (enlarged) veins around the rectum. Sometimes clots will form in the veins. This makes them swollen and painful. These are called thrombosed  hemorrhoids. Causes of hemorrhoids include:  Constipation.   Straining to have a bowel movement.   HEAVY LIFTING  HOME CARE INSTRUCTIONS  Eat a well balanced diet and drink 6 to 8 glasses of water every day to avoid constipation. You may also use a bulk laxative.   Avoid straining to have bowel movements.   Keep anal area dry and clean.   Do not use a donut shaped pillow or sit on the toilet for long periods. This increases blood pooling and pain.   Move your bowels when your body has the urge; this will require less straining and will decrease pain and pressure.

## 2018-02-08 ENCOUNTER — Telehealth: Payer: Self-pay | Admitting: Gastroenterology

## 2018-02-08 NOTE — Telephone Encounter (Addendum)
FIVE SIMPLE ADENOMAS REMOVED.     DRINK WATER TO KEEP YOUR URINE LIGHT YELLOW.  FOLLOW A HIGH FIBER DIET. AVOID ITEMS THAT CAUSE BLOATING & GAS.   CONTINUE YOUR WEIGHT LOSS EFFORTS. A WEIGHT OF 160 LBS OR LESS  WILL GET YOUR BODY MASS INDEX(BMI) UNDER 30.  Next colonoscopy in 3 years.

## 2018-02-09 NOTE — Telephone Encounter (Signed)
Called and informed pt.  

## 2018-02-12 ENCOUNTER — Encounter (HOSPITAL_COMMUNITY): Payer: Self-pay | Admitting: Gastroenterology

## 2018-02-12 NOTE — Telephone Encounter (Signed)
ON RECALL  °

## 2018-02-28 ENCOUNTER — Ambulatory Visit (INDEPENDENT_AMBULATORY_CARE_PROVIDER_SITE_OTHER): Payer: Managed Care, Other (non HMO) | Admitting: Orthopaedic Surgery

## 2018-02-28 ENCOUNTER — Telehealth (INDEPENDENT_AMBULATORY_CARE_PROVIDER_SITE_OTHER): Payer: Self-pay | Admitting: Radiology

## 2018-02-28 ENCOUNTER — Encounter (INDEPENDENT_AMBULATORY_CARE_PROVIDER_SITE_OTHER): Payer: Self-pay | Admitting: Orthopaedic Surgery

## 2018-02-28 VITALS — BP 115/66 | HR 79 | Ht 62.0 in | Wt 175.0 lb

## 2018-02-28 DIAGNOSIS — G5603 Carpal tunnel syndrome, bilateral upper limbs: Secondary | ICD-10-CM | POA: Diagnosis not present

## 2018-02-28 NOTE — Telephone Encounter (Signed)
Referral edited to send to [facility]

## 2018-02-28 NOTE — Telephone Encounter (Signed)
Patient was seen in the office this morning and Dr. Durward Fortes ordered bilateral upper extremity EMG's to [facility].  She wanted to go in [place], but found out that they are not contracted with her insurance. She requests study to be done a [facility] as she has been there before.  Can you please edit referral?  CB for patient is [phone]

## 2018-02-28 NOTE — Progress Notes (Signed)
Office Visit Note   Patient: Daisy Robertson           Date of Birth: [birth year]           MRN: [id number] Visit Date: 02/28/2018              Requested by: Leanna Battles, MD Daisy Robertson, [place] [address] PCP: Leanna Battles, MD   Assessment & Plan: Visit Diagnoses:  1. Carpal tunnel syndrome, bilateral     Plan: Bilateral hand symptoms are consistent with carpal tunnel syndrome.  Problem has been present off and on for many years.  Will obtain EMGs and nerve conduction studies and consider treatment options based on the results  Follow-Up Instructions: Return after EMG's.   Orders:  Orders Placed This Encounter  Procedures  . Ambulatory referral to Physical Medicine Rehab   No orders of the defined types were placed in this encounter.     Procedures: No procedures performed   Clinical Data: No additional findings.   Subjective: Chief Complaint  Patient presents with  . Right Hand - Pain  . Left Hand - Pain  Patient presents with bilateral hand pain, right greater than left. She describes numbness and tingling. The pain seems to shoot through her right thumb and the thumb is numb on the end. This is a chronic condition and now wakes her at night. She states that she has tried braces which do not help. She takes gabapentin and aspirin 81mg  daily.  She is taking diclofenac which helps a little.  [name] relates that she has had trouble with numbness tingling and pain in both of her hands for "years".  She works for [employer] work on a daily basis and thinks that may have "something to do with it".  She has had particular trouble with numbness of the tip of her right thumb but also other fingers.  She is dropping objects, waking up at night and having difficulty driving her car with her hands and to sleep.  The more trouble on the right than the left.  She is tried braces and gabapentin and even diclofenac.  Prior diagnostic  studies HPI  Review of Systems   Objective: Vital Signs: BP 115/66   Pulse 79   Ht 5\' 2"  (1.575 m)   Wt 175 lb (79.4 kg)   BMI 32.01 kg/m   Physical Exam Constitutional:      Appearance: She is well-developed.  Eyes:     Pupils: Pupils are equal, round, and reactive to light.  Pulmonary:     Effort: Pulmonary effort is normal.  Skin:    General: Skin is warm and dry.  Neurological:     Mental Status: She is alert and oriented to person, place, and time.  Psychiatric:        Behavior: Behavior normal.     Ortho Exam awake alert and oriented x3.  Comfortable rest.  Positive Tinel's over the median nerve at both wrists.  No swelling of her hand or digits either side.  Good opposition of thumb to little finger.  Phalen's test was negative no joint swelling.  Good capillary refill above fingers  Specialty Comments:  No specialty comments available.  Imaging: No results found.   PMFS History: Patient Active Problem List   Diagnosis Date Noted  . Special screening for malignant neoplasms, colon    Past Medical History:  Diagnosis Date  . Allergies   . Bilateral carpal tunnel syndrome   .  Hypercholesteremia   . Hypertension   . Migraines   . PONV (postoperative nausea and vomiting)     Family History  Problem Relation Age of Onset  . Breast cancer Sister   . Heart failure Mother   . CAD Mother   . Heart attack Brother   . Hyperlipidemia Brother   . Colon cancer Neg Hx   . Colon polyps Neg Hx     Past Surgical History:  Procedure Laterality Date  . APPENDECTOMY     1981  . bunionectomy Left 2005  . COLONOSCOPY N/A 02/05/2018   Procedure: COLONOSCOPY;  Surgeon: Danie Binder, MD;  [facility];  Service: Endoscopy;  Laterality: N/A;  8:30  . POLYPECTOMY  02/05/2018   Procedure: POLYPECTOMY;  Surgeon: Danie Binder, MD;  [facility];  Service: Endoscopy;;  Cecal,Hep.flex,Transverse,Spl.flex,  . TOTAL ABDOMINAL HYSTERECTOMY  2011    Social History   Occupational History  . Not on file  Tobacco Use  . Smoking status: Never Smoker  . Smokeless tobacco: Never Used  Substance and Sexual Activity  . Alcohol use: No  . Drug use: No  . Sexual activity: Not on file

## 2018-03-09 ENCOUNTER — Telehealth (INDEPENDENT_AMBULATORY_CARE_PROVIDER_SITE_OTHER): Payer: Self-pay | Admitting: Radiology

## 2018-03-09 NOTE — Telephone Encounter (Signed)
Patient left voicemail requesting return call in regards to EMG/NCS. She states that she has not heard anything from [facility].  Per Gabriel Cirri, referral is entered for there and ready to be scheduled. I called patient at [phone] and advised she could call [facility] if she has not heard from them and they should be able to see referral from work que and schedule. She will call me back if needed.

## 2018-04-10 ENCOUNTER — Encounter: Payer: Managed Care, Other (non HMO) | Admitting: Neurology

## 2018-04-10 ENCOUNTER — Ambulatory Visit (INDEPENDENT_AMBULATORY_CARE_PROVIDER_SITE_OTHER): Payer: Managed Care, Other (non HMO) | Admitting: Neurology

## 2018-04-10 DIAGNOSIS — R200 Anesthesia of skin: Secondary | ICD-10-CM | POA: Diagnosis not present

## 2018-04-10 DIAGNOSIS — G5603 Carpal tunnel syndrome, bilateral upper limbs: Secondary | ICD-10-CM | POA: Diagnosis not present

## 2018-04-10 DIAGNOSIS — Z0289 Encounter for other administrative examinations: Secondary | ICD-10-CM

## 2018-04-10 NOTE — Progress Notes (Signed)
Full Name: Elliette Seabolt Gender: Female MRN #: [id number] Date of Birth: 10/10/1965    Visit Date: 04/10/2018 09:06 Age: 53 Years 57 Months Old Examining Physician: Arlice Colt, MD  Referring Physician: Joni Fears, MD     History:  Daisy Robertson is a 53 year old woman with bilateral hand numbness and pain and occasional neck pain.    Strength was slightly reduced in the right APB muscle and normal elsewhere in the arms.  Sensation was normal.  Nerve conductions studies: The right median motor response was mildly slowed across the wrist with a low normal amplitude and normal forearm conduction.  The left median motor response in both ulnar sensory responses were normal.  The right median sensory response was slightly delayed across the wrist.  The right ulnar sensory response was normal.  The left median sensory response was normal but more than 0.5 ms slower than the left ulnar sensory response.  Electromyography: Needle EMG of selected muscles of the right arm showed mild chronic denervation in the biceps.  Other muscles tested were normal.  There was no abnormal spontaneous activity.  Impression: This NCV/EMG study shows the following: 1.    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 2.    Minimal median neuropathy across the left wrist 3.    There is mild chronic denervation in the right biceps while other muscles were normal.  There could be a very mild left C5 or C6 chronic radiculopathy.  Ayson Cherubini A. Felecia Shelling, MD, PhD, FAAN Certified in Neurology, Clinical Neurophysiology, Sleep Medicine, Pain Medicine and Neuroimaging Director, [place] [facility] [address], [place], [place] [address] [phone]            [facility]    Nerve / Sites Muscle Latency Ref. Amplitude Ref. Rel Amp Segments Distance Velocity Ref. Area    ms ms mV mV %  cm m/s  m/s mVms  L Median - APB     Wrist APB 3.2 ?4.4 8.0 ?4.0 100 Wrist - APB 7   32.5     Upper arm APB 6.8  8.0  100 Upper arm - Wrist 19 54 ?49 32.1  R Median - APB     Wrist APB 4.4 ?4.4 4.7 ?4.0 100 Wrist - APB 7   17.6     Upper arm APB 8.2  4.3  91.6 Upper arm - Wrist 19 49 ?49 15.5  L Ulnar - ADM     Wrist ADM 2.0 ?3.3 10.6 ?6.0 100 Wrist - ADM 7   33.4     B.Elbow ADM 5.2  10.3  97.7 B.Elbow - Wrist 17 54 ?49 32.3     A.Elbow ADM 7.2  10.0  96.8 A.Elbow - B.Elbow 10 49 ?49 31.5         A.Elbow - Wrist      R Ulnar - ADM     Wrist ADM 2.3 ?3.3 10.7 ?6.0 100 Wrist - ADM 7   29.3     B.Elbow ADM 5.2  10.4  97.2 B.Elbow - Wrist 17 58 ?49 29.9     A.Elbow ADM 7.0  10.3  98.9 A.Elbow - B.Elbow 10 56 ?49 29.9         A.Elbow - Wrist                 SNC    Nerve / Sites Rec. Site  Peak Lat Ref.  Amp Ref. Segments Distance    ms ms V V  cm  L Median - Orthodromic (Dig II, Mid palm)     Dig II Wrist 3.1 ?3.4 12 ?10 Dig II - Wrist 13  R Median - Orthodromic (Dig II, Mid palm)     Dig II Wrist 3.4 ?3.4 15 ?10 Dig II - Wrist 13  L Ulnar - Orthodromic, (Dig V, Mid palm)     Dig V Wrist 2.3 ?3.1 7 ?5 Dig V - Wrist 11  R Ulnar - Orthodromic, (Dig V, Mid palm)     Dig V Wrist 2.3 ?3.1 8 ?5 Dig V - Wrist 34              F  Wave    Nerve F Lat Ref.   ms ms  L Ulnar - ADM 24.6 ?32.0  R Ulnar - ADM 24.9 ?32.0         EMG full       EMG Summary Table    Spontaneous MUAP Recruitment  Muscle IA Fib PSW Fasc Other Amp Dur. Poly Pattern  R. Deltoid Normal None None None _______ Normal Normal Normal Normal  R. Triceps brachii Normal None None None _______ Normal Normal Normal Normal  R. Biceps brachii Normal None None None _______ Normal Increased 1+ Reduced  R. Extensor digitorum communis Normal None None None _______ Normal Normal Normal Normal  R. First dorsal interosseous Normal None None None _______ Normal Normal Normal Normal  R. Abductor pollicis brevis Normal None None None  _______ Normal Normal Normal Normal  R. Rhomboid major Normal None None None _______ Normal Normal Normal Normal

## 2018-04-11 ENCOUNTER — Ambulatory Visit (INDEPENDENT_AMBULATORY_CARE_PROVIDER_SITE_OTHER): Payer: Managed Care, Other (non HMO) | Admitting: Orthopaedic Surgery

## 2018-04-11 ENCOUNTER — Encounter (INDEPENDENT_AMBULATORY_CARE_PROVIDER_SITE_OTHER): Payer: Self-pay | Admitting: Orthopaedic Surgery

## 2018-04-11 VITALS — BP 120/71 | HR 115 | Ht 62.0 in | Wt 180.0 lb

## 2018-04-11 DIAGNOSIS — G5603 Carpal tunnel syndrome, bilateral upper limbs: Secondary | ICD-10-CM | POA: Diagnosis not present

## 2018-04-11 NOTE — Progress Notes (Signed)
Office Visit Note   Patient: Daisy Robertson           Date of Birth: [birth year]           MRN: [id number] Visit Date: 04/11/2018              Requested by: Leanna Battles, MD [address] [place], [place] [address] PCP: Leanna Battles, MD   Assessment & Plan: Visit Diagnoses:  1. Carpal tunnel syndrome, bilateral     Plan: EMGs and nerve conduction studies were performed at [facility] by Dr. Felecia Shelling.  These demonstrated mild to moderate median neuropathy across the right wrist consistent with carpal tunnel syndrome and minimal medial neuropathy across the left wrist.  There was some mild chronic denervation of the right biceps with the other muscles were normal and possibly a very mild left C5-6 chronic radiculopathy.  Appear to be any symptoms referable to the biceps.  She is experiencing chronic problems with both of her wrists more so right than the left.  She has had prior treatment with splints and anti-inflammatory medicine at this point is very frustrated with her recurrent pain.  She is having issues with dropping objects and numbness and tingling when she uses her cell phone or when she drives her car.  After much discussion and discussing treatment options she would like to proceed with right carpal tunnel syndrome staging it with the left depending upon her results.  We will schedule at her convenience  Follow-Up Instructions: No follow-ups on file.   Orders:  No orders of the defined types were placed in this encounter.  No orders of the defined types were placed in this encounter.     Procedures: No procedures performed   Clinical Data: No additional findings.   Subjective: Chief Complaint  Patient presents with  . Left Wrist - Follow-up  . Right Wrist - Follow-up  Patient presents today for follow up on both wrist.She had an EMG/ NCV on 04/10/2018. Patient is here for results today. No change in symptoms.  Has had chronic problems with  numbness and tingling in both hands but more so right than left using a cell phone or even driving her car.  She has used splints in the past and taken anti-inflammatory medicines EMGs and nerve conduction studies are outlined as above.  She has taken gabapentin 100 mg 3 times a day and Advil without significant relief. she is not diabetic  HPI  Review of Systems   Objective: Vital Signs: BP 120/71   Pulse (!) 115   Ht 5\' 2"  (1.575 m)   Wt 180 lb (81.6 kg)   BMI 32.92 kg/m   Physical Exam Constitutional:      Appearance: She is well-developed.  Eyes:     Pupils: Pupils are equal, round, and reactive to light.  Pulmonary:     Effort: Pulmonary effort is normal.  Skin:    General: Skin is warm and dry.  Neurological:     Mental Status: She is alert and oriented to person, place, and time.  Psychiatric:        Behavior: Behavior normal.     Ortho Exam obvious atrophy of the thenar musculature or other hand.  Able to oppose thumb to little finger.  Does have minimally positive Tinel's over the median nerve on the right wrist and not the left but has definite positive Phalen's on the right.  Full range of motion of her fingers.  Specialty Comments:  No specialty comments available.  Imaging: No results found.   PMFS History: Patient Active Problem List   Diagnosis Date Noted  . Carpal tunnel syndrome, bilateral 04/10/2018  . Numbness 04/10/2018  . Special screening for malignant neoplasms, colon    Past Medical History:  Diagnosis Date  . Allergies   . Bilateral carpal tunnel syndrome   . Hypercholesteremia   . Hypertension   . Migraines   . PONV (postoperative nausea and vomiting)     Family History  Problem Relation Age of Onset  . Breast cancer Sister   . Heart failure Mother   . CAD Mother   . Heart attack Brother   . Hyperlipidemia Brother   . Colon cancer Neg Hx   . Colon polyps Neg Hx     Past Surgical History:  Procedure Laterality Date  .  APPENDECTOMY     1981  . bunionectomy Left 2005  . COLONOSCOPY N/A 02/05/2018   Procedure: COLONOSCOPY;  Surgeon: Danie Binder, MD;  [facility];  Service: Endoscopy;  Laterality: N/A;  8:30  . POLYPECTOMY  02/05/2018   Procedure: POLYPECTOMY;  Surgeon: Danie Binder, MD;  [facility];  Service: Endoscopy;;  Cecal,Hep.flex,Transverse,Spl.flex,  . TOTAL ABDOMINAL HYSTERECTOMY  2011   Social History   Occupational History  . Not on file  Tobacco Use  . Smoking status: Never Smoker  . Smokeless tobacco: Never Used  Substance and Sexual Activity  . Alcohol use: No  . Drug use: No  . Sexual activity: Not on file

## 2018-04-19 ENCOUNTER — Other Ambulatory Visit (INDEPENDENT_AMBULATORY_CARE_PROVIDER_SITE_OTHER): Payer: Self-pay | Admitting: Orthopedic Surgery

## 2018-04-19 DIAGNOSIS — G5601 Carpal tunnel syndrome, right upper limb: Secondary | ICD-10-CM | POA: Diagnosis not present

## 2018-04-19 MED ORDER — HYDROCODONE-ACETAMINOPHEN 5-325 MG PO TABS: 1.0000 | ORAL_TABLET | ORAL | 0 refills | Status: DC | PRN

## 2018-04-20 ENCOUNTER — Telehealth (INDEPENDENT_AMBULATORY_CARE_PROVIDER_SITE_OTHER): Payer: Self-pay | Admitting: Orthopaedic Surgery

## 2018-04-20 NOTE — Telephone Encounter (Signed)
Called and spoke with patient this morning. She had carpal tunnel surgery yesterday and I wanted to make sure she was doing well. Patient states that her block as worn off and she has not needed anything for pain. She is doing well with no complaints or questions. I encouraged her to call if she has any concerns or questions.

## 2018-04-26 ENCOUNTER — Telehealth (INDEPENDENT_AMBULATORY_CARE_PROVIDER_SITE_OTHER): Payer: Self-pay | Admitting: Radiology

## 2018-04-26 ENCOUNTER — Ambulatory Visit (INDEPENDENT_AMBULATORY_CARE_PROVIDER_SITE_OTHER): Payer: Managed Care, Other (non HMO) | Admitting: Orthopaedic Surgery

## 2018-04-26 ENCOUNTER — Other Ambulatory Visit: Payer: Self-pay

## 2018-04-26 ENCOUNTER — Encounter (INDEPENDENT_AMBULATORY_CARE_PROVIDER_SITE_OTHER): Payer: Self-pay | Admitting: Orthopaedic Surgery

## 2018-04-26 VITALS — BP 120/81 | HR 71 | Ht 62.0 in | Wt 180.0 lb

## 2018-04-26 DIAGNOSIS — Z9889 Other specified postprocedural states: Secondary | ICD-10-CM

## 2018-04-26 DIAGNOSIS — G5603 Carpal tunnel syndrome, bilateral upper limbs: Secondary | ICD-10-CM

## 2018-04-26 NOTE — Progress Notes (Signed)
   Post-Op Visit Note   Patient: Daisy Robertson           Date of Birth: December 26, 1965           MRN: [id number] Visit Date: 04/26/2018 PCP: Leanna Battles, MD   Assessment & Plan:  Chief Complaint:  Chief Complaint  Patient presents with  . Right Hand - Routine Post Op    04/19/2018 Right CTR   Visit Diagnoses:  1. Carpal tunnel syndrome, bilateral   Post right carpal tunnel release 04/19/2018 doing well with persistent symptoms with left carpal tunnel syndrome.  Plan: 1 week post right carpal tunnel release by Dr. Joni Fears.  Incision looks good wrist splint applied.  Her disability short-term papers were taken care of.  Recheck 1 week for suture removal.  She has carpal tunnel on the opposite hand and is requesting Vicryl put on surgical schedule for 05/07/2018 so she can get both hands fixed while she is out for work.  Patient has had bilateral carpal tunnel syndrome symptoms for many years.  Electrical studies were positive for bilateral median nerve compression at the carpal canal.  Right hand doing well patient is preop for left carpal tunnel release.  Follow-Up Instructions: Preop for left carpal tunnel release.  Orders:  No orders of the defined types were placed in this encounter.  No orders of the defined types were placed in this encounter.   Imaging: No results found.  PMFS History: Patient Active Problem List   Diagnosis Date Noted  . Carpal tunnel syndrome, bilateral 04/10/2018  . Numbness 04/10/2018  . Special screening for malignant neoplasms, colon    Past Medical History:  Diagnosis Date  . Allergies   . Bilateral carpal tunnel syndrome   . Hypercholesteremia   . Hypertension   . Migraines   . PONV (postoperative nausea and vomiting)     Family History  Problem Relation Age of Onset  . Breast cancer Sister   . Heart failure Mother   . CAD Mother   . Heart attack Brother   . Hyperlipidemia Brother   . Colon cancer Neg Hx   . Colon polyps Neg  Hx     Past Surgical History:  Procedure Laterality Date  . APPENDECTOMY     1981  . bunionectomy Left 2005  . COLONOSCOPY N/A 02/05/2018   Procedure: COLONOSCOPY;  Surgeon: Danie Binder, MD;  [facility];  Service: Endoscopy;  Laterality: N/A;  8:30  . POLYPECTOMY  02/05/2018   Procedure: POLYPECTOMY;  Surgeon: Danie Binder, MD;  [facility];  Service: Endoscopy;;  Cecal,Hep.flex,Transverse,Spl.flex,  . TOTAL ABDOMINAL HYSTERECTOMY  2011   Social History   Occupational History  . Not on file  Tobacco Use  . Smoking status: Never Smoker  . Smokeless tobacco: Never Used  Substance and Sexual Activity  . Alcohol use: No  . Drug use: No  . Sexual activity: Not on file

## 2018-04-26 NOTE — Telephone Encounter (Signed)
Patient brought disability paperwork in to be completed at appt today. Per Dr. Lorin Mercy, take out of work x 4-6 weeks.  Paperwork completed. Patient advised. Copy for her at the front desk in [place] office.

## 2018-04-27 ENCOUNTER — Telehealth (INDEPENDENT_AMBULATORY_CARE_PROVIDER_SITE_OTHER): Payer: Self-pay | Admitting: Orthopaedic Surgery

## 2018-04-27 NOTE — Telephone Encounter (Signed)
FYI:    Patient scheduled for left carpal tunnel release at [place] 05-14-18.  Post op set for 05-24-18 in [place].

## 2018-04-27 NOTE — Telephone Encounter (Signed)
noted 

## 2018-05-03 ENCOUNTER — Encounter (INDEPENDENT_AMBULATORY_CARE_PROVIDER_SITE_OTHER): Payer: Self-pay | Admitting: Orthopaedic Surgery

## 2018-05-03 ENCOUNTER — Ambulatory Visit (INDEPENDENT_AMBULATORY_CARE_PROVIDER_SITE_OTHER): Payer: Managed Care, Other (non HMO) | Admitting: Orthopaedic Surgery

## 2018-05-03 ENCOUNTER — Other Ambulatory Visit: Payer: Self-pay

## 2018-05-03 VITALS — Ht 62.0 in | Wt 180.0 lb

## 2018-05-03 DIAGNOSIS — G5603 Carpal tunnel syndrome, bilateral upper limbs: Secondary | ICD-10-CM

## 2018-05-03 NOTE — Progress Notes (Signed)
   Post-Op Visit Note   Patient: Daisy Robertson           Date of Birth: 05/10/1965           MRN: [id number] Visit Date: 05/03/2018 PCP: Leanna Battles, MD   Assessment & Plan: Post right carpal tunnel release 04/19/2018.  Her left carpal tunnel release was scheduled but is delayed due to  Covid-19 postponement of all elective surgeries.  Work slip given work resumption on 06/11/2018.  Chief Complaint:  Chief Complaint  Patient presents with  . Right Hand - Routine Post Op    04/19/2018 Right Carpal Tunnel Release   Visit Diagnoses:  1. Carpal tunnel syndrome, bilateral     Plan: Work slip given work resumption 06/11/2018.  She can schedule her opposite left carpal tunnel release once elective surgery resumes. Follow-Up Instructions: No follow-ups on file.   Orders:  No orders of the defined types were placed in this encounter.  No orders of the defined types were placed in this encounter.   Imaging: No results found.  PMFS History: Patient Active Problem List   Diagnosis Date Noted  . Carpal tunnel syndrome, bilateral 04/10/2018  . Numbness 04/10/2018  . Special screening for malignant neoplasms, colon    Past Medical History:  Diagnosis Date  . Allergies   . Bilateral carpal tunnel syndrome   . Hypercholesteremia   . Hypertension   . Migraines   . PONV (postoperative nausea and vomiting)     Family History  Problem Relation Age of Onset  . Breast cancer Sister   . Heart failure Mother   . CAD Mother   . Heart attack Brother   . Hyperlipidemia Brother   . Colon cancer Neg Hx   . Colon polyps Neg Hx     Past Surgical History:  Procedure Laterality Date  . APPENDECTOMY     1981  . bunionectomy Left 2005  . COLONOSCOPY N/A 02/05/2018   Procedure: COLONOSCOPY;  Surgeon: Danie Binder, MD;  [facility];  Service: Endoscopy;  Laterality: N/A;  8:30  . POLYPECTOMY  02/05/2018   Procedure: POLYPECTOMY;  Surgeon: Danie Binder, MD;  [facility];  Service: Endoscopy;;  Cecal,Hep.flex,Transverse,Spl.flex,  . TOTAL ABDOMINAL HYSTERECTOMY  2011   Social History   Occupational History  . Not on file  Tobacco Use  . Smoking status: Never Smoker  . Smokeless tobacco: Never Used  Substance and Sexual Activity  . Alcohol use: No  . Drug use: No  . Sexual activity: Not on file

## 2018-05-24 ENCOUNTER — Inpatient Hospital Stay (INDEPENDENT_AMBULATORY_CARE_PROVIDER_SITE_OTHER): Payer: Managed Care, Other (non HMO) | Admitting: Orthopaedic Surgery

## 2018-07-16 DIAGNOSIS — G5602 Carpal tunnel syndrome, left upper limb: Secondary | ICD-10-CM

## 2018-07-26 ENCOUNTER — Other Ambulatory Visit: Payer: Self-pay

## 2018-07-26 ENCOUNTER — Ambulatory Visit (INDEPENDENT_AMBULATORY_CARE_PROVIDER_SITE_OTHER): Payer: Managed Care, Other (non HMO) | Admitting: Orthopaedic Surgery

## 2018-07-26 ENCOUNTER — Encounter: Payer: Self-pay | Admitting: Orthopaedic Surgery

## 2018-07-26 VITALS — BP 110/80 | HR 103 | Ht 62.0 in | Wt 180.0 lb

## 2018-07-26 DIAGNOSIS — G5603 Carpal tunnel syndrome, bilateral upper limbs: Secondary | ICD-10-CM | POA: Diagnosis not present

## 2018-07-26 NOTE — Progress Notes (Signed)
   Post-Op Visit Note   Patient: Daisy Robertson           Date of Birth: [birth year]           MRN: [id number] Visit Date: 07/26/2018 PCP: Leanna Battles, MD   Assessment & Plan: Post carpal tunnel release left.  Right hand was done in March.  She is out of work until 08/13/2018 and can resume work on that date.  Left wrist splint applied today.  She will return on Tuesday for suture removal.  Chief Complaint:  Chief Complaint  Patient presents with  . Left Hand - Routine Post Op    07/16/2018 Left CTR   Visit Diagnoses:  1. Carpal tunnel syndrome, bilateral     Plan: Patient returns post left carpal tunnel release.  She is now 10 days out we will leave sutures until next week she can come in and see Hassan Rowan for suture removal.  Opposite right hand had some widening she still having some tendon swelling in her right hand sometimes difficulty getting her fingers extended and is been using her right hand more since the left hand is postop.  She does typing at work and work slip given no work until 08/13/2018.  She can resume regular work at that time.  She had bilateral carpal tunnel symptoms and in this condition it will take her slightly longer to get back to normal work activity since both hands were involved and both are postop.  Follow-Up Instructions: No follow-ups on file.   Orders:  No orders of the defined types were placed in this encounter.  No orders of the defined types were placed in this encounter.   Imaging: No results found.  PMFS History: Patient Active Problem List   Diagnosis Date Noted  . Carpal tunnel syndrome, bilateral 04/10/2018  . Numbness 04/10/2018  . Special screening for malignant neoplasms, colon    Past Medical History:  Diagnosis Date  . Allergies   . Bilateral carpal tunnel syndrome   . Hypercholesteremia   . Hypertension   . Migraines   . PONV (postoperative nausea and vomiting)     Family History  Problem Relation Age of Onset  .  Breast cancer Sister   . Heart failure Mother   . CAD Mother   . Heart attack Brother   . Hyperlipidemia Brother   . Colon cancer Neg Hx   . Colon polyps Neg Hx     Past Surgical History:  Procedure Laterality Date  . APPENDECTOMY     1981  . bunionectomy Left 2005  . COLONOSCOPY N/A 02/05/2018   Procedure: COLONOSCOPY;  Surgeon: Danie Binder, MD;  [facility];  Service: Endoscopy;  Laterality: N/A;  8:30  . POLYPECTOMY  02/05/2018   Procedure: POLYPECTOMY;  Surgeon: Danie Binder, MD;  [facility];  Service: Endoscopy;;  Cecal,Hep.flex,Transverse,Spl.flex,  . TOTAL ABDOMINAL HYSTERECTOMY  2011   Social History   Occupational History  . Not on file  Tobacco Use  . Smoking status: Never Smoker  . Smokeless tobacco: Never Used  Substance and Sexual Activity  . Alcohol use: No  . Drug use: No  . Sexual activity: Not on file

## 2018-11-02 ENCOUNTER — Other Ambulatory Visit: Payer: Self-pay | Admitting: Podiatry

## 2018-11-02 ENCOUNTER — Encounter: Payer: Self-pay | Admitting: Podiatry

## 2018-11-02 ENCOUNTER — Ambulatory Visit: Payer: Managed Care, Other (non HMO) | Admitting: Podiatry

## 2018-11-02 ENCOUNTER — Other Ambulatory Visit: Payer: Self-pay

## 2018-11-02 ENCOUNTER — Ambulatory Visit (INDEPENDENT_AMBULATORY_CARE_PROVIDER_SITE_OTHER): Payer: Managed Care, Other (non HMO)

## 2018-11-02 VITALS — BP 144/93 | HR 112 | Resp 16

## 2018-11-02 DIAGNOSIS — L600 Ingrowing nail: Secondary | ICD-10-CM | POA: Diagnosis not present

## 2018-11-02 DIAGNOSIS — M79672 Pain in left foot: Secondary | ICD-10-CM

## 2018-11-02 DIAGNOSIS — M79676 Pain in unspecified toe(s): Secondary | ICD-10-CM | POA: Diagnosis not present

## 2018-11-02 DIAGNOSIS — D3613 Benign neoplasm of peripheral nerves and autonomic nervous system of lower limb, including hip: Secondary | ICD-10-CM

## 2018-11-02 DIAGNOSIS — G5763 Lesion of plantar nerve, bilateral lower limbs: Secondary | ICD-10-CM | POA: Diagnosis not present

## 2018-11-02 DIAGNOSIS — E7800 Pure hypercholesterolemia, unspecified: Secondary | ICD-10-CM | POA: Insufficient documentation

## 2018-11-02 DIAGNOSIS — M79671 Pain in right foot: Secondary | ICD-10-CM

## 2018-11-02 MED ORDER — NEOMYCIN-POLYMYXIN-HC 3.5-10000-1 OT SOLN: OTIC | 0 refills | Status: DC

## 2018-11-02 NOTE — Patient Instructions (Signed)

## 2018-11-08 ENCOUNTER — Ambulatory Visit: Payer: Self-pay

## 2018-11-08 ENCOUNTER — Encounter: Payer: Self-pay | Admitting: Orthopaedic Surgery

## 2018-11-08 ENCOUNTER — Ambulatory Visit (INDEPENDENT_AMBULATORY_CARE_PROVIDER_SITE_OTHER): Payer: Managed Care, Other (non HMO) | Admitting: Orthopaedic Surgery

## 2018-11-08 ENCOUNTER — Other Ambulatory Visit: Payer: Self-pay

## 2018-11-08 VITALS — Ht 62.0 in | Wt 170.0 lb

## 2018-11-08 DIAGNOSIS — G5603 Carpal tunnel syndrome, bilateral upper limbs: Secondary | ICD-10-CM

## 2018-11-08 DIAGNOSIS — M25551 Pain in right hip: Secondary | ICD-10-CM | POA: Diagnosis not present

## 2018-11-08 DIAGNOSIS — M65341 Trigger finger, right ring finger: Secondary | ICD-10-CM

## 2018-11-08 NOTE — Progress Notes (Signed)
Office Visit Note   Patient: Daisy Robertson           Date of Birth: March 10, 1965           MRN: [id number] Visit Date: 11/08/2018              Requested by: Leanna Battles, MD [address] [place],  [place] [address] PCP: Leanna Battles, MD   Assessment & Plan: Visit Diagnoses:  1. Pain in right hip   2. Trigger finger, right ring finger   3. Carpal tunnel syndrome, bilateral     Plan: Right ring finger A1 pulley injection performed.  She tolerated the injection well.  Would splint dorsum of the DIP joint that she can use.  If she has persistent triggering to let us know and we discussed trigger finger release if she has chronic triggering.  Follow-Up Instructions: Return if symptoms worsen or fail to improve.   Orders:  Orders Placed This Encounter  Procedures  . Hand/UE Inj: R ring A1  . XR HIP UNILAT W OR W/O PELVIS 2-3 VIEWS RIGHT   No orders of the defined types were placed in this encounter.     Procedures: Hand/UE Inj: R ring A1 for trigger finger on 11/08/2018 2:56 PM Medications: 0.3 mL lidocaine 1 %; 0.33 mL bupivacaine 0.25 %; 13.33 mg methylPREDNISolone acetate 40 MG/ML      Clinical Data: No additional findings.   Subjective: Chief Complaint  Patient presents with  . Right Hand - Pain  . Right Hip - Pain    HPI 53 year old female returns post carpal tunnel release right and left respectively in March and in June.  She has had catching of her right ring finger and some pain with flexion of her long finger.  Ring for is been catching intermittently.  She is also had pain anteriorly in her right hip and states that suddenly occurred when she got up and turned.  She denied numbness or tingling no associated back pain.  Review of Systems 14 point review of systems updated positive hypertension migraines bilateral carpal tunnel, hypercholesterolemia.  Otherwise negative is a pertains HPI.   Objective: Vital Signs: Ht 5\' 2"  (1.575 m)   Wt 170 lb  (77.1 kg)   BMI 31.09 kg/m   Physical Exam Constitutional:      Appearance: She is well-developed.  HENT:     Head: Normocephalic.     Right Ear: External ear normal.     Left Ear: External ear normal.  Eyes:     Pupils: Pupils are equal, round, and reactive to light.  Neck:     Thyroid: No thyromegaly.     Trachea: No tracheal deviation.  Cardiovascular:     Rate and Rhythm: Normal rate.  Pulmonary:     Effort: Pulmonary effort is normal.  Abdominal:     Palpations: Abdomen is soft.  Skin:    General: Skin is warm and dry.  Neurological:     Mental Status: She is alert and oriented to person, place, and time.  Psychiatric:        Behavior: Behavior normal.     Ortho Exam well-healed right and left carpal tunnel incisions.  Exquisite tenderness over the right ring A1 pulley with active triggering demonstrated.  Mild tenderness over the long finger.  Specialty Comments:  No specialty comments available.  Imaging: Xr Hip Unilat W Or W/o Pelvis 2-3 Views Right  Result Date: 11/08/2018 Standing [facility] hips and frog-leg right hip obtained  and reviewed.  This shows maintained joint space normal femoral neck pelvis is normal. Impression: No evidence of the right hip acute or chronic degenerative problems.    PMFS History: Patient Active Problem List   Diagnosis Date Noted  . Trigger finger, right ring finger 11/08/2018  . Pain in right hip 11/08/2018  . Pure hypercholesterolemia 11/02/2018  . Carpal tunnel syndrome, bilateral 04/10/2018  . Numbness 04/10/2018  . Special screening for malignant neoplasms, colon    Past Medical History:  Diagnosis Date  . Allergies   . Bilateral carpal tunnel syndrome   . Hypercholesteremia   . Hypertension   . Migraines   . PONV (postoperative nausea and vomiting)     Family History  Problem Relation Age of Onset  . Breast cancer Sister   . Heart failure Mother   . CAD Mother   . Heart attack Brother   . Hyperlipidemia Brother    . Colon cancer Neg Hx   . Colon polyps Neg Hx     Past Surgical History:  Procedure Laterality Date  . APPENDECTOMY     1981  . bunionectomy Left 2005  . COLONOSCOPY N/A 02/05/2018   Procedure: COLONOSCOPY;  Surgeon: Danie Binder, MD;  Location: [facility];  Service: Endoscopy;  Laterality: N/A;  8:30  . POLYPECTOMY  02/05/2018   Procedure: POLYPECTOMY;  Surgeon: Danie Binder, MD;  Location: [facility];  Service: Endoscopy;;  Cecal,Hep.flex,Transverse,Spl.flex,  . TOTAL ABDOMINAL HYSTERECTOMY  2011   Social History   Occupational History  . Not on file  Tobacco Use  . Smoking status: Never Smoker  . Smokeless tobacco: Never Used  Substance and Sexual Activity  . Alcohol use: No  . Drug use: No  . Sexual activity: Not on file

## 2018-11-09 MED ORDER — BUPIVACAINE HCL 0.25 % IJ SOLN
0.3300 mL | INTRAMUSCULAR | Status: AC | PRN
  Administered 2018-11-08: .33 mL

## 2018-11-09 MED ORDER — METHYLPREDNISOLONE ACETATE 40 MG/ML IJ SUSP
13.3300 mg | INTRAMUSCULAR | Status: AC | PRN
  Administered 2018-11-08: 13.33 mg

## 2018-11-09 MED ORDER — LIDOCAINE HCL 1 % IJ SOLN
0.3000 mL | INTRAMUSCULAR | Status: AC | PRN
  Administered 2018-11-08: .3 mL

## 2018-11-14 NOTE — Progress Notes (Addendum)
Subjective:  Patient ID: Daisy Robertson, female    DOB: August 25, 1965,  MRN: [id number]  Chief Complaint  Patient presents with  . Foot Pain    Bilateral; arches & Dorsal; pt stated, "Stopped taking Gabapentin 400 mg three times a day-did not help; has numbness/tingling; feet are cold; has burning sensation on the top of my feet (below toes"; x1+ yrs  . Nail Problem    Bilateral; great toes-lateral side; pt stated, "Thinks has ingrown toenails; needs both of nails checked; has had a history of having ingrown toenails"; xyrs    52 y.o. female presents with the above complaint. Hx as above.   Review of Systems: Negative except as noted in the HPI. Denies N/V/F/Ch.  Past Medical History:  Diagnosis Date  . Allergies   . Bilateral carpal tunnel syndrome   . Hypercholesteremia   . Hypertension   . Migraines   . PONV (postoperative nausea and vomiting)     Current Outpatient Medications:  .  aspirin EC 81 MG tablet, Take 81 mg by mouth every evening. , Disp: , Rfl:  .  cetirizine (ZYRTEC) 10 MG tablet, Take 10 mg by mouth at bedtime as needed for allergies., Disp: , Rfl:  .  diclofenac (CATAFLAM) 50 MG tablet, TK 1 T PO BID, Disp: , Rfl:  .  FOLIC ACID-VIT Q000111Q 123456 PO, Take 1 tablet by mouth daily., Disp: , Rfl:  .  ibuprofen (ADVIL,MOTRIN) 200 MG tablet, Take 400 mg by mouth daily as needed for moderate pain., Disp: , Rfl:  .  loratadine-pseudoephedrine (CLARITIN-D 12 HOUR) 5-120 MG tablet, Take 1 tablet by mouth 2 (two) times daily as needed for allergies., Disp: , Rfl:  .  losartan-hydrochlorothiazide (HYZAAR) 100-12.5 MG tablet, Take 1 tablet by mouth daily., Disp: , Rfl:  .  Multiple Vitamin (MULTI-VITAMINS) TABS, Take 1 tablet by mouth every evening. , Disp: , Rfl:  .  Omega-3 Fatty Acids (SALMON OIL-1000 PO), Take 1,000 mg by mouth 2 (two) times daily. , Disp: , Rfl:  .  pravastatin (PRAVACHOL) 20 MG tablet, Take 20 mg by mouth every evening. , Disp: , Rfl: 1 .   neomycin-polymyxin-hydrocortisone (CORTISPORIN) OTIC solution, Apply 1-2 drops to toe after soaking twice a day (Patient not taking: Reported on 11/08/2018), Disp: 10 mL, Rfl: 0  Social History   Tobacco Use  Smoking Status Never Smoker  Smokeless Tobacco Never Used    Allergies  Allergen Reactions  . Codeine Diarrhea and Nausea And Vomiting  . Gabapentin     Pt stated, "I did not feel good when taking medicine - made me tired"   Objective:   Vitals:   11/02/18 1449  BP: (!) 144/93  Pulse: (!) 112  Resp: 16   There is no height or weight on file to calculate BMI. Constitutional Well developed. Well nourished.  Vascular Dorsalis pedis pulses palpable bilaterally. Posterior tibial pulses palpable bilaterally. Capillary refill normal to all digits.  No cyanosis or clubbing noted. Pedal hair growth normal.  Neurologic Normal speech. Oriented to person, place, and time. Epicritic sensation to light touch grossly present bilaterally.  Dermatologic Painful ingrowing nail at lateral nail borders of the hallux nail bilaterally. No other open wounds. No skin lesions.  Orthopedic: Normal joint ROM without pain or crepitus bilaterally. No visible deformities. POP 3rd IS bilat with mulder's click   Radiographs: Taken and reviewed no acute fracture or dislocation, no osseous abnormalities. Assessment:   1. Morton's neuroma of both feet   2.  Ingrown nail   3. Pain around toenail    Plan:  Patient was evaluated and treated and all questions answered.  Ingrown Nail, bilaterally -Patient elects to proceed with minor surgery to remove ingrown toenail removal today. Consent reviewed and signed by patient. -Ingrown nail excised. See procedure note. -Educated on post-procedure care including soaking. Written instructions provided and reviewed. -Patient to follow up in 2 weeks for nail check.  Procedure: Excision of Ingrown Toenail Location: Bilateral 1st lateral nail borders.  Anesthesia: Lidocaine 1% plain; 1.5 mL and Marcaine 0.5% plain; 1.5 mL, digital block. Skin Prep: Betadine. Dressing: Silvadene; telfa; dry, sterile, compression dressing. Technique: Following skin prep, the toe was exsanguinated and a tourniquet was secured at the base of the toe. The affected nail border was freed, split with a nail splitter, and excised. Chemical matrixectomy was then performed with phenol and irrigated out with alcohol. The tourniquet was then removed and sterile dressing applied. Disposition: Patient tolerated procedure well. Patient to return in 2 weeks for follow-up.   Interdigital Neuroma, bilaterally -Educated on etiology -Interspace injection delivered as below. -Educated on padding and proper shoegear  Procedure: Neuroma Injection Location: Bilateral 3rd interspace Skin Prep: Alcohol. Injectate: 0.5 cc 0.5% marcaine plain, 0.5 cc dexamethasone phosphate. Disposition: Patient tolerated procedure well. Injection site dressed with a band-aid.   No follow-ups on file.

## 2018-11-16 ENCOUNTER — Other Ambulatory Visit: Payer: Self-pay

## 2018-11-16 ENCOUNTER — Ambulatory Visit (INDEPENDENT_AMBULATORY_CARE_PROVIDER_SITE_OTHER): Payer: Managed Care, Other (non HMO) | Admitting: Podiatry

## 2018-11-16 DIAGNOSIS — G5763 Lesion of plantar nerve, bilateral lower limbs: Secondary | ICD-10-CM | POA: Diagnosis not present

## 2018-11-16 DIAGNOSIS — L600 Ingrowing nail: Secondary | ICD-10-CM | POA: Diagnosis not present

## 2018-12-14 ENCOUNTER — Other Ambulatory Visit: Payer: Self-pay

## 2018-12-14 ENCOUNTER — Ambulatory Visit: Payer: Managed Care, Other (non HMO) | Admitting: Podiatry

## 2018-12-14 ENCOUNTER — Encounter: Payer: Self-pay | Admitting: Podiatry

## 2018-12-14 DIAGNOSIS — G5763 Lesion of plantar nerve, bilateral lower limbs: Secondary | ICD-10-CM

## 2018-12-14 NOTE — Progress Notes (Signed)
Subjective:  Patient ID: Daisy Robertson, female    DOB: [birth year],  MRN: [id number]  Chief Complaint  Patient presents with  . Foot Pain    Follow up neuroma 3rd interspace bilateral   "Its not really any better"    53 y.o. female presents with the above complaint. Hx as above.  Thinks that it injections are helping her only temporary.  Still having pain in the ball of the foot at night.  Unable to tell exactly how long the injections help or if they do help at all   Review of Systems: Negative except as noted in the HPI. Denies N/V/F/Ch.  Past Medical History:  Diagnosis Date  . Allergies   . Bilateral carpal tunnel syndrome   . Hypercholesteremia   . Hypertension   . Migraines   . PONV (postoperative nausea and vomiting)     Current Outpatient Medications:  .  aspirin EC 81 MG tablet, Take 81 mg by mouth every evening. , Disp: , Rfl:  .  cetirizine (ZYRTEC) 10 MG tablet, Take 10 mg by mouth at bedtime as needed for allergies., Disp: , Rfl:  .  diclofenac (CATAFLAM) 50 MG tablet, TK 1 T PO BID, Disp: , Rfl:  .  fluticasone (FLONASE) 50 MCG/ACT nasal spray, SHAKE LQ AND U 1 SPR IEN BID, Disp: , Rfl:  .  FOLIC ACID-VIT Q000111Q 123456 PO, Take 1 tablet by mouth daily., Disp: , Rfl:  .  ibuprofen (ADVIL,MOTRIN) 200 MG tablet, Take 400 mg by mouth daily as needed for moderate pain., Disp: , Rfl:  .  loratadine-pseudoephedrine (CLARITIN-D 12 HOUR) 5-120 MG tablet, Take 1 tablet by mouth 2 (two) times daily as needed for allergies., Disp: , Rfl:  .  losartan-hydrochlorothiazide (HYZAAR) 100-12.5 MG tablet, Take 1 tablet by mouth daily., Disp: , Rfl:  .  Multiple Vitamin (MULTI-VITAMINS) TABS, Take 1 tablet by mouth every evening. , Disp: , Rfl:  .  neomycin-polymyxin-hydrocortisone (CORTISPORIN) OTIC solution, Apply 1-2 drops to toe after soaking twice a day, Disp: 10 mL, Rfl: 0 .  Omega-3 Fatty Acids (SALMON OIL-1000 PO), Take 1,000 mg by mouth 2 (two) times daily. , Disp: , Rfl:  .   pravastatin (PRAVACHOL) 20 MG tablet, Take 20 mg by mouth every evening. , Disp: , Rfl: 1  Social History   Tobacco Use  Smoking Status Never Smoker  Smokeless Tobacco Never Used    Allergies  Allergen Reactions  . Codeine Diarrhea and Nausea And Vomiting  . Gabapentin     Pt stated, "I did not feel good when taking medicine - made me tired"   Objective:   There were no vitals filed for this visit. There is no height or weight on file to calculate BMI. Constitutional Well developed. Well nourished.  Vascular Dorsalis pedis pulses palpable bilaterally. Posterior tibial pulses palpable bilaterally. Capillary refill normal to all digits.  No cyanosis or clubbing noted. Pedal hair growth normal.  Neurologic Normal speech. Oriented to person, place, and time. Epicritic sensation to light touch grossly present bilaterally.  Dermatologic  ingrown nail sites well-healed No other open wounds. No skin lesions.  Orthopedic: Normal joint ROM without pain or crepitus bilaterally. No visible deformities. POP 3rd IS bilat with mulder's click   Radiographs: None Assessment:   1. Morton's neuroma of both feet    Plan:  Patient was evaluated and treated and all questions answered.  Interdigital Neuroma, bilaterally -Discussed proceeding with final set of injections today.  Neck treatment options  include sclerosing injections or MRI or orthotic therapy -Injection #3 as below  Procedure: Neuroma Injection Location: Bilateral 3rd interspace Skin Prep: Alcohol. Injectate: 0.5 cc 0.5% marcaine plain, 0.5 cc dexamethasone phosphate. Disposition: Patient tolerated procedure well. Injection site dressed with a band-aid.  Return in about 3 weeks (around 01/04/2019).

## 2018-12-15 NOTE — Progress Notes (Signed)
Subjective:  Patient ID: Daisy Robertson, female    DOB: [birth year],  MRN: [id number]  Chief Complaint  Patient presents with  . Nail Problem    Bilateral 1st toenail lateral borders check. Pt states healing well, no longer any drainage.  . Neuroma    Pt states injections have not been effective, still has the same pain as previous visit.    53 y.o. female presents with the above complaint. Hx as above.  States that the ingrown nails are doing well does not think that the injections have been that effective but they did help make things feel different for a few days.  Review of Systems: Negative except as noted in the HPI. Denies N/V/F/Ch.  Past Medical History:  Diagnosis Date  . Allergies   . Bilateral carpal tunnel syndrome   . Hypercholesteremia   . Hypertension   . Migraines   . PONV (postoperative nausea and vomiting)     Current Outpatient Medications:  .  aspirin EC 81 MG tablet, Take 81 mg by mouth every evening. , Disp: , Rfl:  .  cetirizine (ZYRTEC) 10 MG tablet, Take 10 mg by mouth at bedtime as needed for allergies., Disp: , Rfl:  .  fluticasone (FLONASE) 50 MCG/ACT nasal spray, SHAKE LQ AND U 1 SPR IEN BID, Disp: , Rfl:  .  ibuprofen (ADVIL,MOTRIN) 200 MG tablet, Take 400 mg by mouth daily as needed for moderate pain., Disp: , Rfl:  .  loratadine-pseudoephedrine (CLARITIN-D 12 HOUR) 5-120 MG tablet, Take 1 tablet by mouth 2 (two) times daily as needed for allergies., Disp: , Rfl:  .  losartan-hydrochlorothiazide (HYZAAR) 100-12.5 MG tablet, Take 1 tablet by mouth daily., Disp: , Rfl:  .  Multiple Vitamin (MULTI-VITAMINS) TABS, Take 1 tablet by mouth every evening. , Disp: , Rfl:  .  neomycin-polymyxin-hydrocortisone (CORTISPORIN) OTIC solution, Apply 1-2 drops to toe after soaking twice a day, Disp: 10 mL, Rfl: 0 .  Omega-3 Fatty Acids (SALMON OIL-1000 PO), Take 1,000 mg by mouth 2 (two) times daily. , Disp: , Rfl:  .  pravastatin (PRAVACHOL) 20 MG tablet, Take 20 mg  by mouth every evening. , Disp: , Rfl: 1 .  diclofenac (CATAFLAM) 50 MG tablet, TK 1 T PO BID, Disp: , Rfl:  .  FOLIC ACID-VIT Q000111Q 123456 PO, Take 1 tablet by mouth daily., Disp: , Rfl:   Social History   Tobacco Use  Smoking Status Never Smoker  Smokeless Tobacco Never Used    Allergies  Allergen Reactions  . Codeine Diarrhea and Nausea And Vomiting  . Gabapentin     Pt stated, "I did not feel good when taking medicine - made me tired"   Objective:   There were no vitals filed for this visit. There is no height or weight on file to calculate BMI. Constitutional Well developed. Well nourished.  Vascular Dorsalis pedis pulses palpable bilaterally. Posterior tibial pulses palpable bilaterally. Capillary refill normal to all digits.  No cyanosis or clubbing noted. Pedal hair growth normal.  Neurologic Normal speech. Oriented to person, place, and time. Epicritic sensation to light touch grossly present bilaterally.  Dermatologic  ingrown nail sites healing well without warmth erythema signs of acute infection No other open wounds. No skin lesions.  Orthopedic: Normal joint ROM without pain or crepitus bilaterally. No visible deformities. POP 3rd IS bilat with mulder's click   Radiographs: None Assessment:   1. Morton's neuroma of both feet   2. Ingrown nail    Plan:  Patient was evaluated and treated and all questions answered.  Ingrown Nails -Healing well without issue  Morton Neuroma -Injections #2 as below  Procedure: Neuroma Injection Location: Bilateral 3rd interspace Skin Prep: Alcohol. Injectate: 0.5 cc 0.5% marcaine plain, 0.5 cc dexamethasone phosphate. Disposition: Patient tolerated procedure well. Injection site dressed with a band-aid.    No follow-ups on file.

## 2019-01-04 ENCOUNTER — Other Ambulatory Visit: Payer: Self-pay

## 2019-01-04 ENCOUNTER — Ambulatory Visit: Payer: Managed Care, Other (non HMO) | Admitting: Podiatry

## 2019-01-04 DIAGNOSIS — G5763 Lesion of plantar nerve, bilateral lower limbs: Secondary | ICD-10-CM

## 2019-01-04 NOTE — Progress Notes (Signed)
Subjective:  Patient ID: Daisy Robertson, female    DOB: Feb 14, 1966,  MRN: [id number]  Chief Complaint  Patient presents with  . Neuroma    Pt states previous injections have not helped her, pain still present and the same.    53 y.o. female presents with the above complaint. Hx as above.  Only got relief for a day or so with the most recent injection. States she still has burning and pain in the ball of both feet. Padding does help  Review of Systems: Negative except as noted in the HPI. Denies N/V/F/Ch.  Past Medical History:  Diagnosis Date  . Allergies   . Bilateral carpal tunnel syndrome   . Hypercholesteremia   . Hypertension   . Migraines   . PONV (postoperative nausea and vomiting)     Current Outpatient Medications:  .  aspirin EC 81 MG tablet, Take 81 mg by mouth every evening. , Disp: , Rfl:  .  cetirizine (ZYRTEC) 10 MG tablet, Take 10 mg by mouth at bedtime as needed for allergies., Disp: , Rfl:  .  fluticasone (FLONASE) 50 MCG/ACT nasal spray, SHAKE LQ AND U 1 SPR IEN BID, Disp: , Rfl:  .  ibuprofen (ADVIL,MOTRIN) 200 MG tablet, Take 400 mg by mouth daily as needed for moderate pain., Disp: , Rfl:  .  loratadine-pseudoephedrine (CLARITIN-D 12 HOUR) 5-120 MG tablet, Take 1 tablet by mouth 2 (two) times daily as needed for allergies., Disp: , Rfl:  .  losartan-hydrochlorothiazide (HYZAAR) 100-12.5 MG tablet, Take 1 tablet by mouth daily., Disp: , Rfl:  .  Multiple Vitamin (MULTI-VITAMINS) TABS, Take 1 tablet by mouth every evening. , Disp: , Rfl:  .  neomycin-polymyxin-hydrocortisone (CORTISPORIN) OTIC solution, Apply 1-2 drops to toe after soaking twice a day, Disp: 10 mL, Rfl: 0 .  Omega-3 Fatty Acids (SALMON OIL-1000 PO), Take 1,000 mg by mouth 2 (two) times daily. , Disp: , Rfl:  .  pravastatin (PRAVACHOL) 20 MG tablet, Take 20 mg by mouth every evening. , Disp: , Rfl: 1 .  diclofenac (CATAFLAM) 50 MG tablet, TK 1 T PO BID, Disp: , Rfl:  .  FOLIC ACID-VIT Q000111Q  B12 PO, Take 1 tablet by mouth daily., Disp: , Rfl:   Social History   Tobacco Use  Smoking Status Never Smoker  Smokeless Tobacco Never Used    Allergies  Allergen Reactions  . Codeine Diarrhea and Nausea And Vomiting  . Gabapentin     Pt stated, "I did not feel good when taking medicine - made me tired"   Objective:   There were no vitals filed for this visit. There is no height or weight on file to calculate BMI. Constitutional Well developed. Well nourished.  Vascular Dorsalis pedis pulses palpable bilaterally. Posterior tibial pulses palpable bilaterally. Capillary refill normal to all digits.  No cyanosis or clubbing noted. Pedal hair growth normal.  Neurologic Normal speech. Oriented to person, place, and time. Epicritic sensation to light touch grossly present bilaterally.  Dermatologic  ingrown nail sites well-healed No other open wounds. No skin lesions.  Orthopedic: Normal joint ROM without pain or crepitus bilaterally. No visible deformities. POP 3rd IS bilat with mulder's click   Radiographs: None Assessment:   1. Morton's neuroma of both feet    Plan:  Patient was evaluated and treated and all questions answered.  Interdigital Neuroma, bilaterally -Defer injection today. -Order MRI for eval of left foot neuroma. Will consider surgical excision vs sclerosing therapy.   No follow-ups  on file.

## 2019-01-07 ENCOUNTER — Telehealth: Payer: Self-pay | Admitting: *Deleted

## 2019-01-07 DIAGNOSIS — G5763 Lesion of plantar nerve, bilateral lower limbs: Secondary | ICD-10-CM

## 2019-01-07 NOTE — Telephone Encounter (Signed)
I spoke with pt and asked imaging center preference and she stated somewhere close. I offered [facility] and she accepted.

## 2019-01-07 NOTE — Telephone Encounter (Signed)
Daisy Robertson, CMA starts Daisy Robertson requires clinicals for pre-cert, Case: [id number], MRI left foot [phone] with and without contrast.

## 2019-01-07 NOTE — Telephone Encounter (Signed)
-----   Message from Evelina Bucy, DPM sent at 01/04/2019  1:32 PM EST ----- Can we order MRI left foot. Eval for neuroma

## 2019-01-28 ENCOUNTER — Telehealth: Payer: Self-pay | Admitting: *Deleted

## 2019-01-28 NOTE — Telephone Encounter (Signed)
Left message informing pt I had checked with [employer] the pre-cert group used by [facility] stating the case was still pending and left the case number. Pt called immediately after and I informed that she may be able to contact customer service and see what else was needed.

## 2019-01-28 NOTE — Telephone Encounter (Signed)
Pt called for status of the MRI. I researched on [place] states the MRI left foot with and without contrast [id number] for Case: [id number] is still pending.

## 2019-01-29 NOTE — Telephone Encounter (Signed)
Pt states [employer] did not receive the clinicals of 01/07/2019. I refaxed 01/29/2019 with the same case number and circled the confirmation date and notification for 01/07/2019.

## 2019-02-05 NOTE — Telephone Encounter (Signed)
Received at main fax machine 02/01/2019 and given to me 02/04/2019. I routed request to DR. Patel to perform PEER TO PEER [phone] in Dr. Eleanora Neighbor absence for Case:  [id number], MRI of left foot with and without contrast [id number].

## 2019-02-05 NOTE — Telephone Encounter (Signed)
I called the insurance company. They stated that reason for denial was not having plain xray copy. They stated to send them in and it will get approved. Fax number is [phone].  Let me know if you need anything else.  Thanks

## 2019-02-05 NOTE — Telephone Encounter (Signed)
Faxed all clinicals and the 11/02/2018 right foot x-ray images to [employer].

## 2019-02-06 NOTE — Telephone Encounter (Signed)
Left message informing pt her insurance was requesting x-rays and the initial visit did not have a result of the x-rays noted, there were x-rays for that appt 11/02/2018 and if faxed a copy to the insurance company as a request for reconsideration.

## 2019-02-06 NOTE — Telephone Encounter (Signed)
Pt states she received notification the MRI was still denied and wanted to know the status or next step, may leave a message.

## 2019-02-14 ENCOUNTER — Telehealth: Payer: Self-pay | Admitting: Podiatry

## 2019-02-14 NOTE — Telephone Encounter (Signed)
Pt was inquiring regarding status from  [id number] for precert for MRI.  Advised Val resent to insurance with Xray report.  Due to holidays there may be a delay and office would contact her.

## 2019-02-18 NOTE — Telephone Encounter (Signed)
Pt called stating she had received word from her insurance company that her MRI has been rejected again and would like to know what the next step will be.

## 2019-02-19 NOTE — Telephone Encounter (Signed)
Left message informing pt 02/06/2019 I had faxed the x-ray image copies from the clinicals of 11/02/2018, and had not received any information from her insurance concerning the denial.

## 2019-03-05 ENCOUNTER — Encounter: Payer: Self-pay | Admitting: Podiatry

## 2019-03-06 ENCOUNTER — Telehealth: Payer: Self-pay | Admitting: *Deleted

## 2019-03-06 NOTE — Telephone Encounter (Signed)
[employer] - no current pre-cert pending.

## 2019-03-08 ENCOUNTER — Telehealth: Payer: Self-pay | Admitting: *Deleted

## 2019-03-08 NOTE — Telephone Encounter (Signed)
Called patient's insurance on 03/08/2019  [facility] [phone] - Customer Service Case # [id number]  Tried to get an MRI Approval for patient's Left Foot W WO Contrast per Dr. Eleanora Neighbor request. Patient has Morton's Neuroma.  Was denied prior authorization approval.  Faxed over to [facility] 20 pages that included all of Dr. Eleanora Neighbor notes for that to go to Clinical Review for approval.  Waiting for approval from insurance company.

## 2019-03-13 NOTE — Telephone Encounter (Signed)
Can we do a peer to peer?

## 2019-03-13 NOTE — Telephone Encounter (Signed)
PEER TO PEER to [facility] for Case: [id number]. [phone].

## 2019-03-13 NOTE — Telephone Encounter (Signed)
[employer] denied MRI left foot with and without contrast [id number], Case:  [id number], stating they need radiology results.

## 2019-03-14 ENCOUNTER — Telehealth: Payer: Self-pay | Admitting: *Deleted

## 2019-03-14 NOTE — Telephone Encounter (Signed)
Pt called for status of MRI.

## 2019-03-14 NOTE — Telephone Encounter (Signed)
[employer] - Rob A. transferred to Physician Support - Gay Filler. scheduled Case: [id number] for PEER TO PEER for MRI [id number] on March 15 2019 at 1:15pm with Dr. Roslynn Amble Posillico.

## 2019-03-14 NOTE — Telephone Encounter (Signed)
I informed pt Dr. March Rummage is going to schedule PEER TO PEER with her insurance company 03/15/2019.

## 2019-03-15 NOTE — Telephone Encounter (Signed)
Marguerita Merles transferred to Physician Support - Debra scheduled for PEER TO PEER for Case: [id number] on Monday 03/18/2019 at 1:00pm with Dr. Benjamine Mola.

## 2019-03-15 NOTE — Telephone Encounter (Signed)
Can you reschedule peer to peer?

## 2019-03-28 NOTE — Telephone Encounter (Signed)
Patient left a message, asking about the prior authorization for her MRI. I spoke to Dr. March Rummage, he was ready and available for the peer to peer on 03/18/2019, but the other physician hung up. Now the peer to peer needs to be rescheduled.

## 2019-03-29 NOTE — Telephone Encounter (Signed)
See note. Daisy Robertson

## 2019-04-05 ENCOUNTER — Other Ambulatory Visit: Payer: Self-pay

## 2019-04-05 ENCOUNTER — Telehealth: Payer: Self-pay | Admitting: *Deleted

## 2019-04-05 ENCOUNTER — Ambulatory Visit: Payer: Managed Care, Other (non HMO) | Admitting: Podiatry

## 2019-04-05 VITALS — Temp 97.3°F

## 2019-04-05 DIAGNOSIS — L600 Ingrowing nail: Secondary | ICD-10-CM

## 2019-04-05 NOTE — Patient Instructions (Signed)

## 2019-04-05 NOTE — Telephone Encounter (Signed)
Called and spoke with Collie Siad from [employer] and the prior authorization was approved and the number is [id number] and Dr March Rummage did speak to the representative. Lattie Haw

## 2019-04-08 NOTE — Telephone Encounter (Signed)
I informed pt prior authorization had been received from [employer] [id number], [id number], VALID 03/08/2019 - 09/04/2019. Faxed to [facility] scheduling.

## 2019-04-16 ENCOUNTER — Ambulatory Visit (HOSPITAL_COMMUNITY)
Admission: RE | Admit: 2019-04-16 | Discharge: 2019-04-16 | Disposition: A | Discharge status: Home / Self Care | Payer: Managed Care, Other (non HMO) | Source: Ambulatory Visit | Attending: Podiatry | Admitting: Podiatry

## 2019-04-16 ENCOUNTER — Other Ambulatory Visit: Payer: Self-pay

## 2019-04-16 DIAGNOSIS — G5763 Lesion of plantar nerve, bilateral lower limbs: Secondary | ICD-10-CM

## 2019-04-16 MED ORDER — GADOBUTROL 1 MMOL/ML IV SOLN
7.0000 mL | Freq: Once | INTRAVENOUS | Status: AC | PRN
  Administered 2019-04-16: 7 mL via INTRAVENOUS

## 2019-04-26 ENCOUNTER — Other Ambulatory Visit: Payer: Self-pay

## 2019-04-26 ENCOUNTER — Ambulatory Visit: Payer: Managed Care, Other (non HMO) | Admitting: Podiatry

## 2019-04-26 DIAGNOSIS — G5763 Lesion of plantar nerve, bilateral lower limbs: Secondary | ICD-10-CM | POA: Diagnosis not present

## 2019-05-15 NOTE — Progress Notes (Signed)
  Subjective:  Patient ID: Daisy Robertson, female    DOB: 10-13-1965,  MRN: [id number]  Chief Complaint  Patient presents with  . Ingrown Toenail    Bilateral, lateral borders. x"Several wks". Pt stated, "I think they've come back. Pain varies - I go barefoot all the time". Pt requests nail trim.  . Foot Problem    Pt wants to discuss "MRI denial".    54 y.o. female presents with the above complaint. History confirmed with patient.   Objective:  Physical Exam: warm, good capillary refill, no trophic changes or ulcerative lesions, normal DP and PT pulses and normal sensory exam.  Painful ingrowing nail at lateral border of the left, right, hallux; without warmth, erythema or drainage  Assessment:   1. Ingrown nail      Plan:  Patient was evaluated and treated and all questions answered.  Ingrown Nail, bilateral -Patient elects to proceed with ingrown toenail removal today -Ingrown nail excised. See procedure note. -Educated on post-procedure care including soaking. Written instructions provided. -Rx for Cortisporin drops -Patient to follow up in 2 weeks for nail check.  Procedure: Excision of Ingrown Toenail Location: Bilateral 1st toe lateral nail borders. Anesthesia: Lidocaine 1% plain; 1.55mL and Marcaine 0.5% plain; 1.81mL, digital block. Skin Prep: Betadine. Dressing: Silvadene; telfa; dry, sterile, compression dressing. Technique: Following skin prep, the toe was exsanguinated and a tourniquet was secured at the base of the toe. The affected nail border was freed, split with a nail splitter, and excised. Chemical matrixectomy was then performed with phenol and irrigated out with alcohol. The tourniquet was then removed and sterile dressing applied. Disposition: Patient tolerated procedure well. Patient to return in 2 weeks for follow-up.   Morton Neuroma -Advised we had gotten approval and she should be able to schedule soon  Return in about 2 weeks (around 04/19/2019)  for Nail Check.

## 2019-05-24 ENCOUNTER — Other Ambulatory Visit: Payer: Self-pay

## 2019-05-24 ENCOUNTER — Ambulatory Visit: Payer: Managed Care, Other (non HMO) | Admitting: Podiatry

## 2019-05-24 DIAGNOSIS — G5763 Lesion of plantar nerve, bilateral lower limbs: Secondary | ICD-10-CM | POA: Diagnosis not present

## 2019-05-24 NOTE — Progress Notes (Signed)
  Subjective:  Patient ID: Daisy Robertson, female    DOB: 08-09-1965,  MRN: [id number]  Chief Complaint  Patient presents with  . Neuroma    F?U BL neuroma Pt. states," not any better, everything is the same" -pain depens on activites or how long on feet Tx: none    54 y.o. female presents with the above complaint. History confirmed with patient.   Objective:  Physical Exam: warm, good capillary refill, no trophic changes or ulcerative lesions, normal DP and PT pulses and normal sensory exam. Bilat: POP 3rd Intersapce bilat  Assessment:   1. Morton's metatarsalgia, neuralgia, or neuroma, bilateral    Plan:  Patient was evaluated and treated and all questions answered.  Morton Neuroma  -Start sclerosing injection today  Procedure: Sclerosing Nerve Injection Location: Bilateral 3rd interspace Skin Prep: Alcohol. Injectate: 1.5 cc 4% sclerosing alcohol injection Disposition: Patient tolerated procedure well. Injection site dressed with a band-aid.  Return in about 2 weeks (around 06/07/2019) for Neuroma, Bilateral.

## 2019-06-07 ENCOUNTER — Ambulatory Visit: Payer: Managed Care, Other (non HMO) | Admitting: Podiatry

## 2019-06-07 ENCOUNTER — Other Ambulatory Visit: Payer: Self-pay

## 2019-06-07 DIAGNOSIS — G5763 Lesion of plantar nerve, bilateral lower limbs: Secondary | ICD-10-CM

## 2019-06-21 ENCOUNTER — Other Ambulatory Visit: Payer: Self-pay

## 2019-06-21 ENCOUNTER — Ambulatory Visit: Payer: Managed Care, Other (non HMO) | Admitting: Podiatry

## 2019-06-21 DIAGNOSIS — G5763 Lesion of plantar nerve, bilateral lower limbs: Secondary | ICD-10-CM | POA: Diagnosis not present

## 2019-07-05 ENCOUNTER — Other Ambulatory Visit: Payer: Self-pay

## 2019-07-05 ENCOUNTER — Encounter: Payer: Self-pay | Admitting: Podiatry

## 2019-07-05 ENCOUNTER — Ambulatory Visit: Payer: Managed Care, Other (non HMO) | Admitting: Podiatry

## 2019-07-05 VITALS — Temp 97.5°F

## 2019-07-05 DIAGNOSIS — G5763 Lesion of plantar nerve, bilateral lower limbs: Secondary | ICD-10-CM

## 2019-07-07 NOTE — Progress Notes (Signed)
  Subjective:  Patient ID: Daisy Robertson, female    DOB: 02/05/1966,  MRN: [id number]  Chief Complaint  Patient presents with  . Neuroma    Follow-up; Bilateral; pt stated, "Injection helped last time; does not have as much pain as last time"   54 y.o. female presents with the above complaint. History confirmed with patient.   Objective:  Physical Exam: warm, good capillary refill, no trophic changes or ulcerative lesions, normal DP and PT pulses and normal sensory exam. Bilat: POP 3rd Intersapce bilat  Assessment:   1. Morton's metatarsalgia, neuralgia, or neuroma, bilateral    Plan:  Patient was evaluated and treated and all questions answered.  Morton Neuroma  -Repeat sclerosing injection today  Procedure: Neurolysis Location: Bilateral 3rd interspace Skin Prep: Alcohol. Injectate: 4% alcohol sclerosing injection. Disposition: Patient tolerated procedure well. Injection site dressed with a band-aid.   Return in about 2 weeks (around 07/19/2019) for neuroma.

## 2019-07-19 ENCOUNTER — Ambulatory Visit: Payer: Managed Care, Other (non HMO) | Admitting: Podiatry

## 2019-07-19 ENCOUNTER — Other Ambulatory Visit: Payer: Self-pay

## 2019-07-19 VITALS — Temp 97.3°F

## 2019-07-19 DIAGNOSIS — G5763 Lesion of plantar nerve, bilateral lower limbs: Secondary | ICD-10-CM | POA: Diagnosis not present

## 2019-07-19 NOTE — Progress Notes (Signed)
  Subjective:  Patient ID: Daisy Robertson, female    DOB: 30-Mar-1965,  MRN: [id number]  Chief Complaint  Patient presents with  . Neuroma    Pt stated, "It's about the same - just a touch better than it was". Pt could not rate her pain on the pain scale.   54 y.o. female presents with the above complaint. History confirmed with patient.   Objective:  Physical Exam: warm, good capillary refill, no trophic changes or ulcerative lesions, normal DP and PT pulses and normal sensory exam. Bilat: POP 3rd Intersapce bilat  Assessment:   1. Morton's metatarsalgia, neuralgia, or neuroma, bilateral    Plan:  Patient was evaluated and treated and all questions answered.  Morton Neuroma  -Repeat sclerosing injection today. Injection #5  Procedure: Neurolysis Location: Bilateral 3rd interspace Skin Prep: Alcohol. Injectate: 4% alcohol sclerosing injection. Disposition: Patient tolerated procedure well. Injection site dressed with a band-aid.    Return in about 2 weeks (around 08/02/2019).

## 2019-07-19 NOTE — Progress Notes (Signed)
  Subjective:  Patient ID: Daisy Robertson, female    DOB: 1965-07-22,  MRN: [id number]  Chief Complaint  Patient presents with  . Neuroma    Pt states injection has helped a little but she is still not where she wants to be.    54 y.o. female presents with the above complaint. History confirmed with patient.   Objective:  Physical Exam: warm, good capillary refill, no trophic changes or ulcerative lesions, normal DP and PT pulses and normal sensory exam. Bilat: POP 3rd Intersapce bilat  Assessment:   1. Morton's metatarsalgia, neuralgia, or neuroma, bilateral    Plan:  Patient was evaluated and treated and all questions answered.  Morton Neuroma  -Sclerosing injection #2 today  Procedure: Neurolysis Location: Bilateral 3rd interspace Skin Prep: Alcohol. Injectate: 4% alcohol sclerosing injection. Disposition: Patient tolerated procedure well. Injection site dressed with a band-aid.   Return in about 2 weeks (around 06/21/2019) for Neuroma sclerosing injeciton f/u .

## 2019-07-19 NOTE — Progress Notes (Addendum)
  Subjective:  Patient ID: Daisy Robertson, female    DOB: 12-Jan-1966,  MRN: [id number]  Chief Complaint  Patient presents with  . Neuroma    Pt states no improvement.   54 y.o. female presents with the above complaint. History confirmed with patient. Does think it is better than what is is since starting the injection therapy.   Objective:  Physical Exam: warm, good capillary refill, no trophic changes or ulcerative lesions, normal DP and PT pulses and normal sensory exam. Bilat: POP 3rd Intersapce bilat  Assessment:   1. Morton's metatarsalgia, neuralgia, or neuroma, bilateral    Plan:  Patient was evaluated and treated and all questions answered.  Morton Neuroma  -Repeat sclerosing injection. Injection #3  Procedure: Neurolysis Location: Bilateral 3rd interspace Skin Prep: Alcohol. Injectate: 4% alcohol sclerosing injection. Disposition: Patient tolerated procedure well. Injection site dressed with a band-aid.   No follow-ups on file.

## 2019-08-02 ENCOUNTER — Other Ambulatory Visit: Payer: Self-pay

## 2019-08-02 ENCOUNTER — Ambulatory Visit: Payer: Managed Care, Other (non HMO) | Admitting: Podiatry

## 2019-08-02 DIAGNOSIS — G5763 Lesion of plantar nerve, bilateral lower limbs: Secondary | ICD-10-CM | POA: Diagnosis not present

## 2019-08-02 NOTE — Progress Notes (Signed)
  Subjective:  Patient ID: Daisy Robertson, female    DOB: [birth year],  MRN: [id number]  Chief Complaint  Patient presents with  . Neuroma    Pt states previous injection did help a little.   54 y.o. female presents with the above complaint. History confirmed with patient.   Objective:  Physical Exam: warm, good capillary refill, no trophic changes or ulcerative lesions, normal DP and PT pulses and normal sensory exam. Bilat: POP 3rd Intersapce bilat  Assessment:   1. Morton's metatarsalgia, neuralgia, or neuroma, bilateral    Plan:  Patient was evaluated and treated and all questions answered.  Morton Neuroma  -Repeat sclerosing injection today. Injection #6  Procedure: Neurolysis Location: Bilateral 3rd interspace Skin Prep: Alcohol. Injectate: 4% alcohol sclerosing injection. Disposition: Patient tolerated procedure well. Injection site dressed with a band-aid.   Return in about 3 weeks (around 08/23/2019) for Bilateral, Neuroma.

## 2019-08-23 ENCOUNTER — Ambulatory Visit: Payer: Managed Care, Other (non HMO) | Admitting: Podiatry

## 2019-08-25 NOTE — Progress Notes (Signed)
  Subjective:  Patient ID: Daisy Robertson, female    DOB: [birth year],  MRN: [id number]  No chief complaint on file.   54 y.o. female presents with the above complaint. History confirmed with patient.   Objective:  Physical Exam: warm, good capillary refill, no trophic changes or ulcerative lesions, normal DP and PT pulses and normal sensory exam. POP 3rd interspace bilateral.  No ingrown nail of the right great toe  Assessment:   1. Morton's neuroma of both feet      Plan:  Patient was evaluated and treated and all questions answered.  Morton Neuroma -MRI Reviewed -Injection delivered neuromas bilat   Procedure: Neuroma Injection Location: Bilateral 3rd interspace Skin Prep: Alcohol. Injectate: 0.5 cc 0.5% marcaine plain, 0.5 cc dexamethasone phosphate. Disposition: Patient tolerated procedure well. Injection site dressed with a band-aid.   Return in about 3 weeks (around 05/17/2019) for Neuroma, Bilateral.

## 2021-02-01 ENCOUNTER — Encounter: Payer: Self-pay | Admitting: *Deleted

## 2021-03-17 IMAGING — MR MR FOOT*L* WO/W CM
4 of 9 series · 22 of 40 positions shown · IV contrast (gadavist)
Comparison: Radiographs 11/02/2018

CLINICAL DATA: Left foot pain for 2 years. Morton's neuroma
suspected. History bunionectomy. No recent injury.

EXAM:
MRI OF THE LEFT FOREFOOT WITHOUT AND WITH CONTRAST
TECHNIQUE: Multiplanar, multisequence MR imaging of the left forefoot was
performed both before and after administration of intravenous
contrast.
CONTRAST:  7mL GADAVIST GADOBUTROL 1 MMOL/ML IV SOLN

[Series 7: T1 · coronal · 4.0mm · 0.23mm/px · 7 of 36 slices shown (1 of 2)]
[im 1/36]
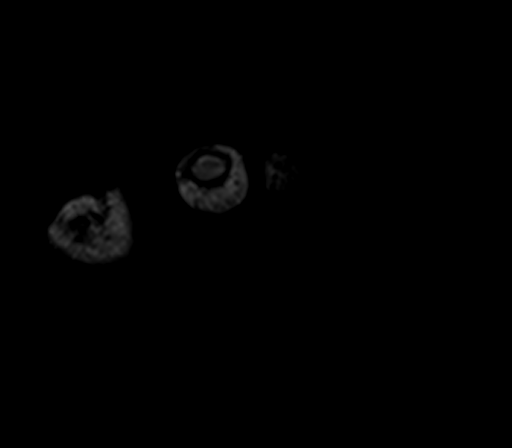
[im 6/36]
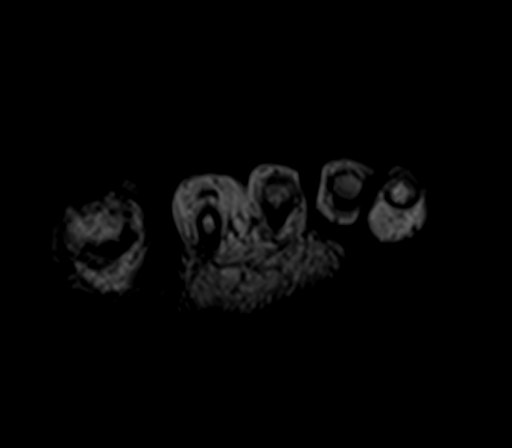
[im 12/36]
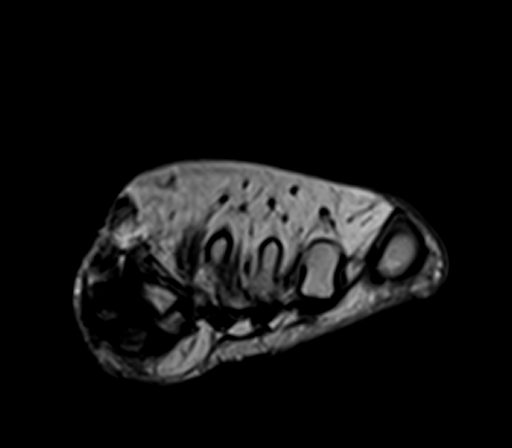
[im 18/36]
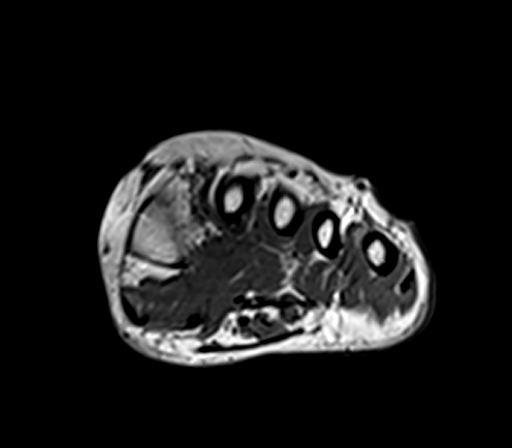
[im 24/36]
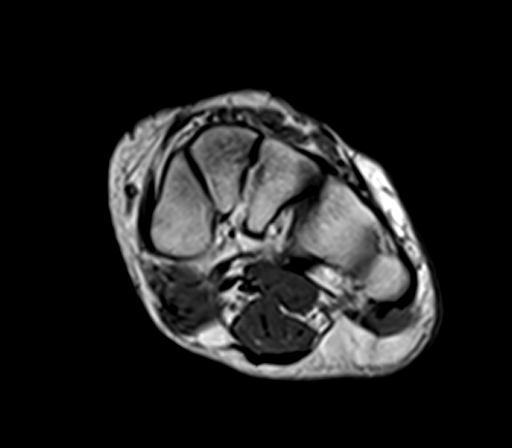
[im 30/36]
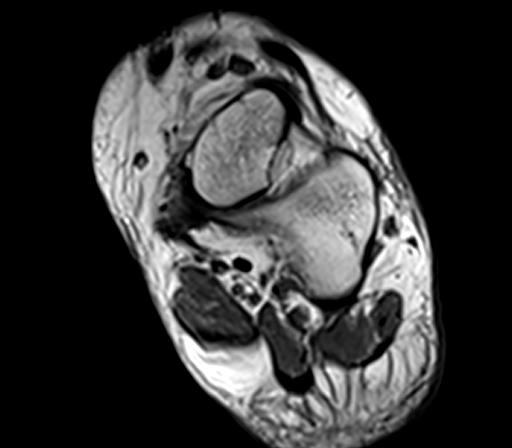
[im 36/36]
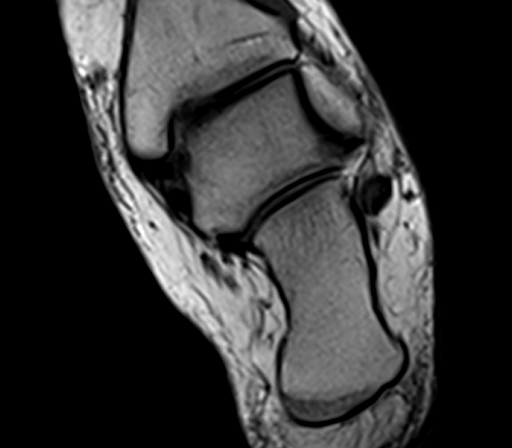

[Series 9: t1fs axial · coronal · 4.0mm · 0.23mm/px · 6 of 36 slices shown]
[im 1/36]
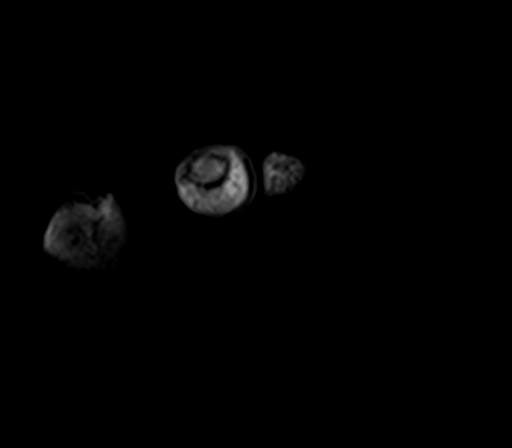
[im 8/36]
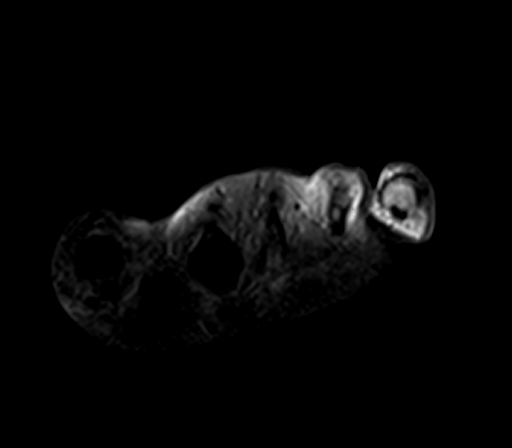
[im 15/36]
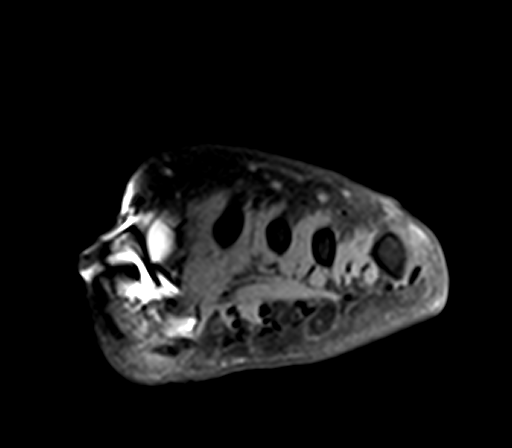
[im 22/36]
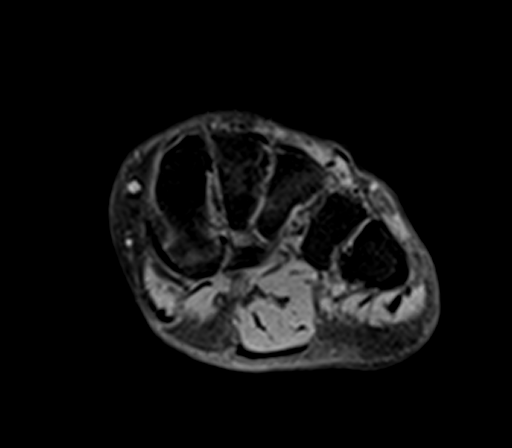
[im 29/36]
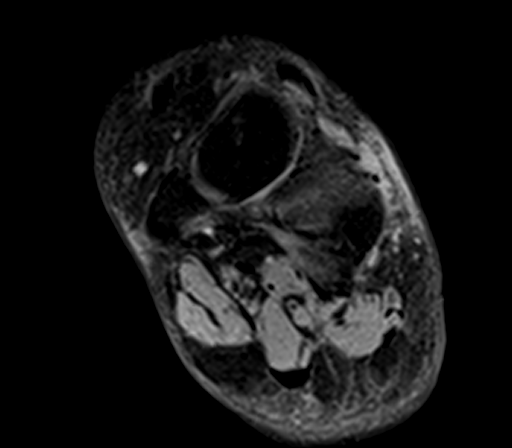
[im 36/36]
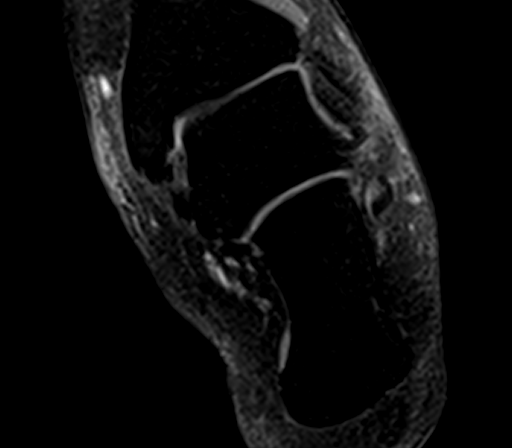

[Series 11: t2fs axial · coronal · 4.0mm · 0.23mm/px · 6 of 36 slices shown]
[im 1/36]
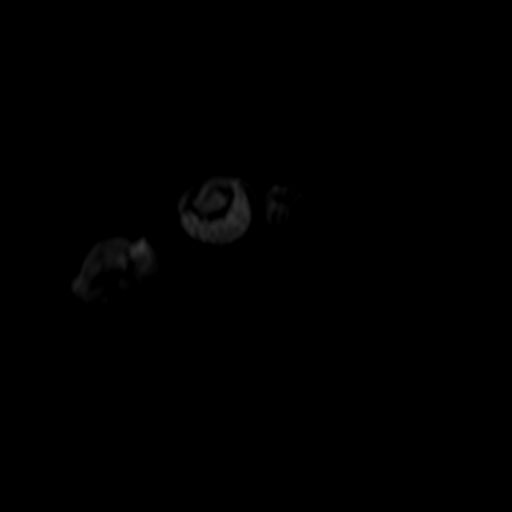
[im 8/36]
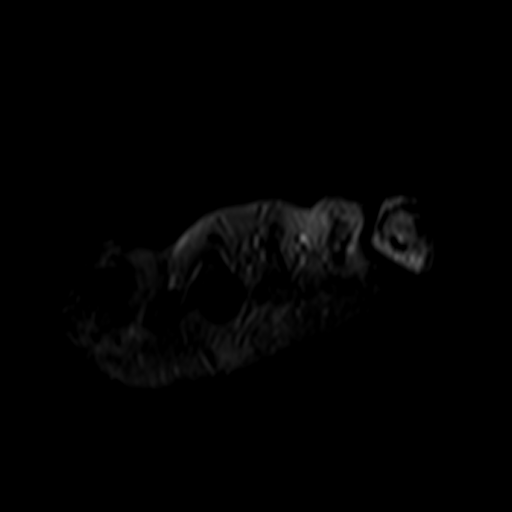
[im 15/36]
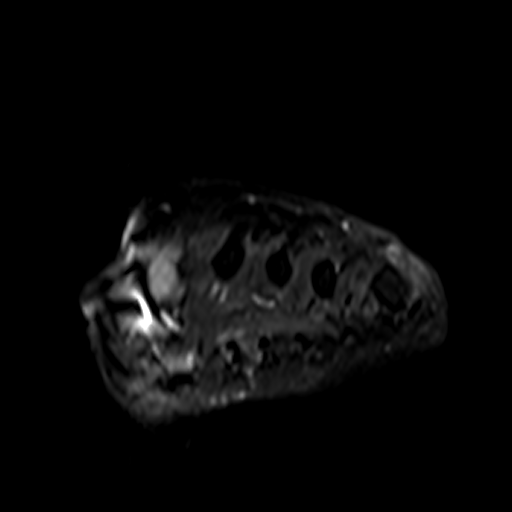
[im 22/36]
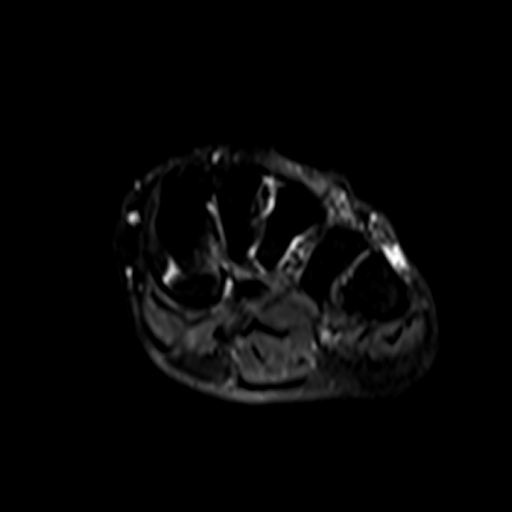
[im 29/36]
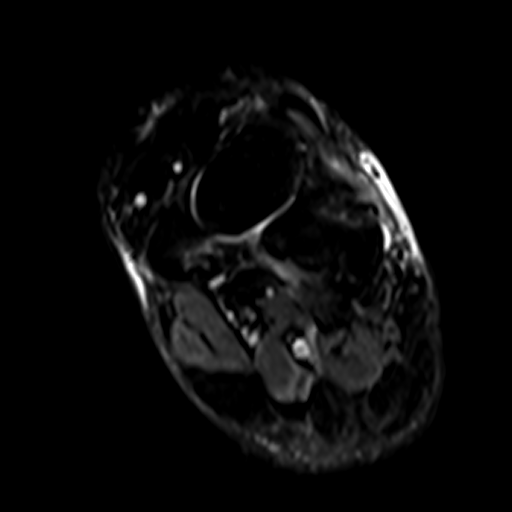
[im 36/36]
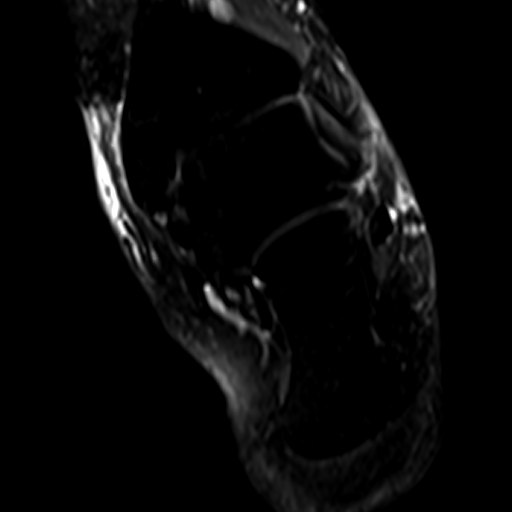

[Series 13: T1 · oblique · 4.0mm · 0.43mm/px · 3 of 20 slices shown (2 of 2)]
[im 1/20]
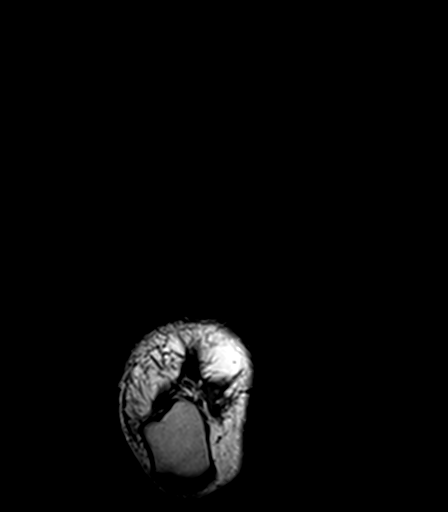
[im 10/20]
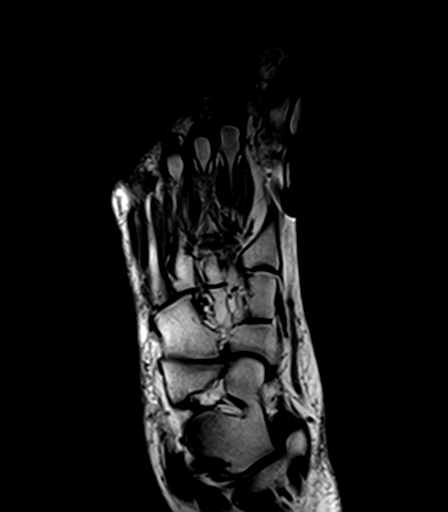
[im 20/20]
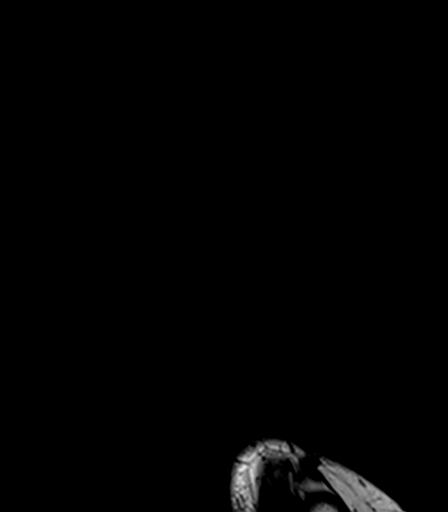

[22 of 40 positions shown; findings below may reference images not displayed]

FINDINGS: Bones/Joint/Cartilage

There is susceptibility artifact within the distal 1st metatarsal
related to previous osteotomy. No evidence of acute fracture or
dislocation. No focal osseous lesions, significant arthropathic
changes or joint effusions.

Ligaments

The Lisfranc ligament is intact. The collateral ligaments of the
metatarsophalangeal joints appear intact.

Muscles and Tendons

Unremarkable. No significant tenosynovitis or abnormal enhancement.

Soft tissues

No webspace abnormalities are identified. Specifically, no evidence
Morton's neuroma. No fluid collection or abnormal enhancement.
IMPRESSION: 1. No evidence of Morton's neuroma.
2. Postsurgical changes within the distal 1st metatarsal.
